# Patient Record
Sex: Female | Born: 1990 | Hispanic: No | Marital: Married | Smoking: Never smoker
Health system: Southern US, Community
[De-identification: ages and names within clinical notes are randomized; demographics above are authoritative.]

## PROBLEM LIST (undated history)

## (undated) ENCOUNTER — Inpatient Hospital Stay (HOSPITAL_COMMUNITY): Payer: Self-pay

## (undated) DIAGNOSIS — Z789 Other specified health status: Secondary | ICD-10-CM

## (undated) HISTORY — PX: NO PAST SURGERIES: SHX2092

---

## 2015-05-10 ENCOUNTER — Inpatient Hospital Stay (HOSPITAL_COMMUNITY)
Admission: AD | Admit: 2015-05-10 | Discharge: 2015-05-10 | Disposition: A | Discharge status: Home / Self Care | Payer: Medicaid Other | Source: Ambulatory Visit | Attending: Obstetrics & Gynecology | Admitting: Obstetrics & Gynecology

## 2015-05-10 ENCOUNTER — Encounter (HOSPITAL_COMMUNITY): Payer: Self-pay | Admitting: *Deleted

## 2015-05-10 DIAGNOSIS — O212 Late vomiting of pregnancy: Secondary | ICD-10-CM | POA: Diagnosis not present

## 2015-05-10 DIAGNOSIS — O219 Vomiting of pregnancy, unspecified: Secondary | ICD-10-CM | POA: Diagnosis not present

## 2015-05-10 DIAGNOSIS — Z3A22 22 weeks gestation of pregnancy: Secondary | ICD-10-CM | POA: Diagnosis not present

## 2015-05-10 HISTORY — DX: Other specified health status: Z78.9

## 2015-05-10 LAB — URINALYSIS, ROUTINE W REFLEX MICROSCOPIC
BILIRUBIN URINE: NEGATIVE
Glucose, UA: 100 mg/dL — AB
KETONES UR: NEGATIVE mg/dL
NITRITE: NEGATIVE
PH: 7.5 (ref 5.0–8.0)
PROTEIN: 100 mg/dL — AB
Specific Gravity, Urine: 1.02 (ref 1.005–1.030)
UROBILINOGEN UA: 1 mg/dL (ref 0.0–1.0)

## 2015-05-10 LAB — COMPREHENSIVE METABOLIC PANEL
ALT: 13 U/L — ABNORMAL LOW (ref 14–54)
AST: 20 U/L (ref 15–41)
Albumin: 3.2 g/dL — ABNORMAL LOW (ref 3.5–5.0)
Alkaline Phosphatase: 31 U/L — ABNORMAL LOW (ref 38–126)
Anion gap: 8 (ref 5–15)
BUN: 10 mg/dL (ref 6–20)
CO2: 26 mmol/L (ref 22–32)
Calcium: 8.9 mg/dL (ref 8.9–10.3)
Chloride: 99 mmol/L — ABNORMAL LOW (ref 101–111)
Creatinine, Ser: 0.72 mg/dL (ref 0.44–1.00)
GFR calc Af Amer: >60 mL/min (ref >60)
GFR calc non Af Amer: >60 mL/min (ref >60)
Glucose, Bld: 82 mg/dL (ref 65–99)
Potassium: 3.7 mmol/L (ref 3.5–5.1)
Sodium: 133 mmol/L — ABNORMAL LOW (ref 135–145)
Total Bilirubin: 0.3 mg/dL (ref 0.3–1.2)
Total Protein: 7.3 g/dL (ref 6.5–8.1)

## 2015-05-10 LAB — CBC
HCT: 33.4 % — ABNORMAL LOW (ref 36.0–46.0)
Hemoglobin: 11.5 g/dL — ABNORMAL LOW (ref 12.0–15.0)
MCH: 28.9 pg (ref 26.0–34.0)
MCHC: 34.4 g/dL (ref 30.0–36.0)
MCV: 83.9 fL (ref 78.0–100.0)
Platelets: 316 10*3/uL (ref 150–400)
RBC: 3.98 MIL/uL (ref 3.87–5.11)
RDW: 12.6 % (ref 11.5–15.5)
WBC: 8.5 10*3/uL (ref 4.0–10.5)

## 2015-05-10 LAB — URINE MICROSCOPIC-ADD ON

## 2015-05-10 LAB — DIFFERENTIAL
Basophils Absolute: 0 10*3/uL (ref 0.0–0.1)
Basophils Relative: 0 % (ref 0–1)
Eosinophils Absolute: 0.1 10*3/uL (ref 0.0–0.7)
Eosinophils Relative: 1 % (ref 0–5)
Lymphocytes Relative: 30 % (ref 12–46)
Lymphs Abs: 2.6 10*3/uL (ref 0.7–4.0)
Monocytes Absolute: 0.5 10*3/uL (ref 0.1–1.0)
Monocytes Relative: 6 % (ref 3–12)
Neutro Abs: 5.3 10*3/uL (ref 1.7–7.7)
Neutrophils Relative %: 63 % (ref 43–77)
Other: 0 %

## 2015-05-10 LAB — HEPATITIS B SURFACE ANTIGEN: Hepatitis B Surface Ag: NEGATIVE

## 2015-05-10 LAB — TYPE AND SCREEN
ABO/RH(D): A POS
Antibody Screen: NEGATIVE

## 2015-05-10 LAB — ABO/RH: ABO/RH(D): A POS

## 2015-05-10 MED ORDER — LACTATED RINGERS IV SOLN
INTRAVENOUS | Status: DC
Start: 2015-05-10 — End: 2015-05-11
  Administered 2015-05-10: 19:00:00 via INTRAVENOUS

## 2015-05-10 MED ORDER — SODIUM CHLORIDE 0.9 % IV SOLN
25.0000 mg | Freq: Once | INTRAVENOUS | Status: AC
  Administered 2015-05-10: 25 mg via INTRAVENOUS
  Filled 2015-05-10: qty 1

## 2015-05-10 MED ORDER — DIPHENHYDRAMINE HCL 50 MG/ML IJ SOLN
25.0000 mg | Freq: Once | INTRAMUSCULAR | Status: AC
  Administered 2015-05-10: 25 mg via INTRAVENOUS
  Filled 2015-05-10: qty 1

## 2015-05-10 MED ORDER — METOCLOPRAMIDE HCL 10 MG PO TABS: 10.0000 mg | ORAL_TABLET | Freq: Four times a day (QID) | ORAL | Status: DC

## 2015-05-10 MED ORDER — LORAZEPAM 2 MG/ML IJ SOLN
0.5000 mg | Freq: Once | INTRAMUSCULAR | Status: AC
  Administered 2015-05-10: 0.5 mg via INTRAVENOUS
  Filled 2015-05-10: qty 0.25

## 2015-05-10 NOTE — MAU Provider Note (Signed)
  History     CSN: [id number]  Arrival date and time: 05/10/15 1607   First Provider Initiated Contact with Patient 05/10/15 1726      Chief Complaint  Patient presents with  . Emesis During Pregnancy   Emesis  This is a recurrent problem. The current episode started more than 1 month ago. The problem occurs 2 to 4 times per day. The problem has been gradually worsening. The emesis has an appearance of stomach contents. There has been no fever. Pertinent negatives include no abdominal pain, chest pain, chills, diarrhea, dizziness, fever or headaches.    Alison Hooper 24 y.o. G1P0 @ [redacted]w[redacted]d presents to the MAU stating that she has had n/v of pregnancy for the past 2 months. She recently moved from the [place] and was seen for prenatal care.  Past Medical History  Diagnosis Date  . Medical history non-contributory     Past Surgical History  Procedure Laterality Date  . No past surgeries      No family history on file.  Social History  Substance Use Topics  . Smoking status: Never Smoker   . Smokeless tobacco: None  . Alcohol Use: No    Allergies: No Known Allergies  No prescriptions prior to admission    Review of Systems  Constitutional: Negative for fever and chills.  Respiratory: Negative for shortness of breath.   Cardiovascular: Negative for chest pain.  Gastrointestinal: Positive for nausea and vomiting. Negative for abdominal pain, diarrhea and constipation.  Neurological: Negative for dizziness and headaches.  All other systems reviewed and are negative.  Physical Exam   Blood pressure 109/70, pulse 80, temperature 99.2 F (37.3 C), temperature source Oral, resp. rate 16, height  (1.499 m), weight 42.366 kg (93 lb 6.4 oz), last menstrual period 02/16/2015.  Physical Exam  Nursing note and vitals reviewed. Constitutional: She is oriented to person, place, and time. She appears well-developed and well-nourished. No distress.  HENT:  Head:  Normocephalic and atraumatic.  Neck: Normal range of motion.  Cardiovascular: Normal rate.   Respiratory: Effort normal. No respiratory distress.  GI: Soft.  Musculoskeletal: Normal range of motion.  Neurological: She is alert and oriented to person, place, and time.  Skin: Skin is warm and dry.  Psychiatric: She has a normal mood and affect. Her behavior is normal. Judgment and thought content normal.    MAU Course  Procedures  MDM Iv/ Phenergan caused agitated reaction in pt; Changed POC to Lr and Benadryl . Pt is feeling better; will continue to monitor. Turn over care to L. Leftwich  Assessment and Plan  Nausea and Vomiting of pregnancy  Clemmons,Lori Grissett 05/10/2015, 5:58 PM   1. Nausea and vomiting during pregnancy prior to [redacted] weeks gestation     D/C home Reglan 10 mg PO Q 6 hours PRN F/U with early prenatal care Return to MAU as needed for emergencies  LEFTWICH-KIRBY, Nicolaus Andel, CNM 8:45 PM

## 2015-05-10 NOTE — Discharge Instructions (Signed)
Nausea medication to take during pregnancy:   Unisom (doxylamine succinate 25 mg tablets) Take one tablet daily at bedtime. If symptoms are not adequately controlled, the dose can be increased to a maximum recommended dose of two tablets daily (1/2 tablet in the morning, 1/2 tablet mid-afternoon and one at bedtime).  Vitamin B6 100mg  tablets. Take one tablet twice a day (up to 200 mg per day).  Add Reglan as prescribed to take as needed.   Morning Sickness Morning sickness is when you feel sick to your stomach (nauseous) during pregnancy. This nauseous feeling may or may not come with vomiting. It often occurs in the morning but can be a problem any time of day. Morning sickness is most common during the first trimester, but it may continue throughout pregnancy. While morning sickness is unpleasant, it is usually harmless unless you develop severe and continual vomiting (hyperemesis gravidarum). This condition requires more intense treatment.  CAUSES  The cause of morning sickness is not completely known but seems to be related to normal hormonal changes that occur in pregnancy. RISK FACTORS You are at greater risk if you:  Experienced nausea or vomiting before your pregnancy.  Had morning sickness during a previous pregnancy.  Are pregnant with more than one baby, such as twins. TREATMENT  Do not use any medicines (prescription, over-the-counter, or herbal) for morning sickness without first talking to your health care provider. Your health care provider may prescribe or recommend:  Vitamin B6 supplements.  Anti-nausea medicines.  The herbal medicine ginger. HOME CARE INSTRUCTIONS   Only take over-the-counter or prescription medicines as directed by your health care provider.  Taking multivitamins before getting pregnant can prevent or decrease the severity of morning sickness in most women.  Eat a piece of dry toast or unsalted crackers before getting out of bed in the  morning.  Eat five or six small meals a day.  Eat dry and bland foods (rice, baked potato). Foods high in carbohydrates are often helpful.  Do not drink liquids with your meals. Drink liquids between meals.  Avoid greasy, fatty, and spicy foods.  Get someone to cook for you if the smell of any food causes nausea and vomiting.  If you feel nauseous after taking prenatal vitamins, take the vitamins at night or with a snack.  Snack on protein foods (nuts, yogurt, cheese) between meals if you are hungry.  Eat unsweetened gelatins for desserts.  Wearing an acupressure wristband (worn for sea sickness) may be helpful.  Acupuncture may be helpful.  Do not smoke.  Get a humidifier to keep the air in your house free of odors.  Get plenty of fresh air. SEEK MEDICAL CARE IF:   Your home remedies are not working, and you need medicine.  You feel dizzy or lightheaded.  You are losing weight. SEEK IMMEDIATE MEDICAL CARE IF:   You have persistent and uncontrolled nausea and vomiting.  You pass out (faint). MAKE SURE YOU:  Understand these instructions.  Will watch your condition.  Will get help right away if you are not doing well or get worse. Document Released: 10/25/2006 Document Revised: 09/08/2013 Document Reviewed: 02/18/2013 [facility] Patient Information 2015 [place], [place]. This information is not intended to replace advice given to you by your health care provider. Make sure you discuss any questions you have with your health care provider. Dehydration, Adult Dehydration is when you lose more fluids from the body than you take in. Vital organs like the kidneys, brain, and heart cannot  function without a proper amount of fluids and salt. Any loss of fluids from the body can cause dehydration.  CAUSES   Vomiting.  Diarrhea.  Excessive sweating.  Excessive urine output.  Fever. SYMPTOMS  Mild dehydration  Thirst.  Dry lips.  Slightly dry mouth. Moderate  dehydration  Very dry mouth.  Sunken eyes.  Skin does not bounce back quickly when lightly pinched and released.  Dark urine and decreased urine production.  Decreased tear production.  Headache. Severe dehydration  Very dry mouth.  Extreme thirst.  Rapid, weak pulse (more than 100 beats per minute at rest).  Cold hands and feet.  Not able to sweat in spite of heat and temperature.  Rapid breathing.  Blue lips.  Confusion and lethargy.  Difficulty being awakened.  Minimal urine production.  No tears. DIAGNOSIS  Your caregiver will diagnose dehydration based on your symptoms and your exam. Blood and urine tests will help confirm the diagnosis. The diagnostic evaluation should also identify the cause of dehydration. TREATMENT  Treatment of mild or moderate dehydration can often be done at home by increasing the amount of fluids that you drink. It is best to drink small amounts of fluid more often. Drinking too much at one time can make vomiting worse. Refer to the home care instructions below. Severe dehydration needs to be treated at the hospital where you will probably be given intravenous (IV) fluids that contain water and electrolytes. HOME CARE INSTRUCTIONS   Ask your caregiver about specific rehydration instructions.  Drink enough fluids to keep your urine clear or pale yellow.  Drink small amounts frequently if you have nausea and vomiting.  Eat as you normally do.  Avoid:  Foods or drinks high in sugar.  Carbonated drinks.  Juice.  Extremely hot or cold fluids.  Drinks with caffeine.  Fatty, greasy foods.  Alcohol.  Tobacco.  Overeating.  Gelatin desserts.  Wash your hands well to avoid spreading bacteria and viruses.  Only take over-the-counter or prescription medicines for pain, discomfort, or fever as directed by your caregiver.  Ask your caregiver if you should continue all prescribed and over-the-counter medicines.  Keep all  follow-up appointments with your caregiver. SEEK MEDICAL CARE IF:  You have abdominal pain and it increases or stays in one area (localizes).  You have a rash, stiff neck, or severe headache.  You are irritable, sleepy, or difficult to awaken.  You are weak, dizzy, or extremely thirsty. SEEK IMMEDIATE MEDICAL CARE IF:   You are unable to keep fluids down or you get worse despite treatment.  You have frequent episodes of vomiting or diarrhea.  You have blood or green matter (bile) in your vomit.  You have blood in your stool or your stool looks black and tarry.  You have not urinated in 6 to 8 hours, or you have only urinated a small amount of very dark urine.  You have a fever.  You faint. MAKE SURE YOU:   Understand these instructions.  Will watch your condition.  Will get help right away if you are not doing well or get worse. Document Released: 09/03/2005 Document Revised: 11/26/2011 Document Reviewed: 04/23/2011 [facility] Patient Information 2015 [place], [place]. This information is not intended to replace advice given to you by your health care provider. Make sure you discuss any questions you have with your health care provider.

## 2015-05-10 NOTE — MAU Note (Signed)
L. Clemmons CNM notified of pt beginning to feel relief from agitation and restlessness after benedryl. Will reassess in 15 minutes

## 2015-05-10 NOTE — MAU Note (Signed)
Pt resting in bed. States she still feels restless but it is better. Requested to get up to bathroom. Offered assistance but patient denied. Ambulated to bathroom.

## 2015-05-10 NOTE — MAU Note (Signed)
Vomiting for the last month, 6-9 times yesterday, twice today.  Denies pain or bleeding.  Pos HPT 2 months ago.

## 2015-05-11 LAB — HIV ANTIBODY (ROUTINE TESTING W REFLEX): HIV Screen 4th Generation wRfx: NONREACTIVE

## 2015-05-11 LAB — CULTURE, OB URINE: Culture: NO GROWTH

## 2015-05-11 LAB — RPR: RPR Ser Ql: NONREACTIVE

## 2015-05-11 LAB — RUBELLA SCREEN: Rubella: 7.4 index (ref >0.99)

## 2015-06-20 ENCOUNTER — Ambulatory Visit (INDEPENDENT_AMBULATORY_CARE_PROVIDER_SITE_OTHER): Payer: Medicaid Other | Admitting: Obstetrics and Gynecology

## 2015-06-20 ENCOUNTER — Other Ambulatory Visit (HOSPITAL_COMMUNITY)
Admission: RE | Admit: 2015-06-20 | Discharge: 2015-06-20 | Disposition: A | Discharge status: Home / Self Care | Payer: Medicaid Other | Source: Ambulatory Visit | Attending: Obstetrics and Gynecology | Admitting: Obstetrics and Gynecology

## 2015-06-20 ENCOUNTER — Encounter: Payer: Self-pay | Admitting: Obstetrics and Gynecology

## 2015-06-20 VITALS — BP 111/66 | HR 90 | Wt 98.0 lb

## 2015-06-20 DIAGNOSIS — Z124 Encounter for screening for malignant neoplasm of cervix: Secondary | ICD-10-CM | POA: Diagnosis not present

## 2015-06-20 DIAGNOSIS — Z113 Encounter for screening for infections with a predominantly sexual mode of transmission: Secondary | ICD-10-CM | POA: Insufficient documentation

## 2015-06-20 DIAGNOSIS — Z118 Encounter for screening for other infectious and parasitic diseases: Secondary | ICD-10-CM | POA: Diagnosis not present

## 2015-06-20 DIAGNOSIS — Z01419 Encounter for gynecological examination (general) (routine) without abnormal findings: Secondary | ICD-10-CM | POA: Insufficient documentation

## 2015-06-20 DIAGNOSIS — Z1151 Encounter for screening for human papillomavirus (HPV): Secondary | ICD-10-CM

## 2015-06-20 DIAGNOSIS — Z3402 Encounter for supervision of normal first pregnancy, second trimester: Secondary | ICD-10-CM

## 2015-06-20 DIAGNOSIS — Z3400 Encounter for supervision of normal first pregnancy, unspecified trimester: Secondary | ICD-10-CM | POA: Insufficient documentation

## 2015-06-20 LAB — POCT URINALYSIS DIP (DEVICE)
Bilirubin Urine: NEGATIVE
GLUCOSE, UA: NEGATIVE mg/dL
KETONES UR: NEGATIVE mg/dL
Nitrite: NEGATIVE
Specific Gravity, Urine: 1.025 (ref 1.005–1.030)
UROBILINOGEN UA: 0.2 mg/dL (ref 0.0–1.0)
pH: 7 (ref 5.0–8.0)

## 2015-06-20 NOTE — Progress Notes (Signed)
   Subjective:    Alison Hooper is a G1P0 [redacted]w[redacted]d being seen today for her first obstetrical visit.  Her obstetrical history is significant for first preganncy. Patient does intend to breast feed. Pregnancy history fully reviewed.  Patient reports no complaints.  Filed Vitals:   06/20/15 0900  BP: 111/66  Pulse: 90  Weight: 98 lb (44.453 kg)    HISTORY: OB History  Gravida Para Term Preterm AB SAB TAB Ectopic Multiple Living  1             # Outcome Date GA Lbr Len/2nd Weight Sex Delivery Anes PTL Lv  1 Current              Past Medical History  Diagnosis Date  . Medical history non-contributory    Past Surgical History  Procedure Laterality Date  . No past surgeries     Family History  Problem Relation Age of Onset  . Hypertension Mother   . Hypertension Father      Exam    Uterus:     Pelvic Exam:    Perineum: No Hemorrhoids, Normal Perineum   Vulva: normal   Vagina:  normal mucosa, normal discharge   pH:    Cervix: closed, thick, long   Adnexa: not evaluated   Bony Pelvis: gynecoid  System: Breast:  normal appearance, no masses or tenderness   Skin: normal coloration and turgor, no rashes    Neurologic: oriented, no focal deficits   Extremities: normal strength, tone, and muscle mass   HEENT extra ocular movement intact   Mouth/Teeth mucous membranes moist, pharynx normal without lesions and dental hygiene good   Neck supple and no masses   Cardiovascular: regular rate and rhythm   Respiratory:  chest clear, no wheezing, crepitations, rhonchi, normal symmetric air entry   Abdomen: soft, non-tender; bowel sounds normal; no masses,  no organomegaly   Urinary:       Assessment:    Pregnancy: G1P0 Patient Active Problem List   Diagnosis Date Noted  . Encounter for supervision of normal first pregnancy 06/20/2015        Plan:     Initial labs drawn. Prenatal vitamins. Problem list reviewed and updated. Genetic Screening discussed Quad  Screen: ordered.  Ultrasound discussed; fetal survey: ordered.  Follow up in 4 weeks. 50% of 30 min visit spent on counseling and coordination of care.     Daizy Outen 06/20/2015

## 2015-06-20 NOTE — Progress Notes (Signed)
Pt requesting anatomy scan. She refuses flu shot. Had some [place] in [place]

## 2015-06-20 NOTE — Progress Notes (Signed)
Ultrasound scheduled for 06/24/2015 @

## 2015-06-20 NOTE — Addendum Note (Signed)
Addended by: Sherre Lain A on: 06/20/2015 10:27 AM   Modules accepted: Orders

## 2015-06-21 LAB — PRESCRIPTION MONITORING PROFILE (19 PANEL)
Amphetamine/Meth: NEGATIVE ng/mL
Barbiturate Screen, Urine: NEGATIVE ng/mL
Benzodiazepine Screen, Urine: NEGATIVE ng/mL
Buprenorphine, Urine: NEGATIVE ng/mL
CARISOPRODOL, URINE: NEGATIVE ng/mL
Cannabinoid Scrn, Ur: NEGATIVE ng/mL
Cocaine Metabolites: NEGATIVE ng/mL
Creatinine, Urine: 188.96 mg/dL (ref >20.0)
ECSTASY: NEGATIVE ng/mL
FENTANYL URINE: NEGATIVE ng/mL
MEPERIDINE UR: NEGATIVE ng/mL
METHADONE SCREEN, URINE: NEGATIVE ng/mL
Methaqualone: NEGATIVE ng/mL
NITRITES URINE, INITIAL: NEGATIVE ug/mL
Opiate Screen, Urine: NEGATIVE ng/mL
Oxycodone Screen, Ur: NEGATIVE ng/mL
PROPOXYPHENE: NEGATIVE ng/mL
Phencyclidine, Ur: NEGATIVE ng/mL
TRAMADOL UR: NEGATIVE ng/mL
Tapentadol, urine: NEGATIVE ng/mL
ZOLPIDEM, URINE: NEGATIVE ng/mL
pH, Initial: 8.4 pH (ref 4.5–8.9)

## 2015-06-21 LAB — CYTOLOGY - PAP

## 2015-06-21 LAB — GC/CHLAMYDIA PROBE AMP (~~LOC~~) NOT AT ARMC
CHLAMYDIA, DNA PROBE: NEGATIVE
Neisseria Gonorrhea: NEGATIVE

## 2015-06-22 LAB — AFP, QUAD SCREEN
AFP: 67.8 ng/mL
Curr Gest Age: 19.3 wks.days
HCG TOTAL: 47.69 [IU]/mL
INH: 304.9 pg/mL
INTERPRETATION-AFP: NEGATIVE
MOM FOR AFP: 1.06
MoM for INH: 1.44
MoM for hCG: 1.72
OPEN SPINA BIFIDA: NEGATIVE
Osb Risk: 1:10900 {titer}
TRI 18 SCR RISK EST: NEGATIVE
Trisomy 18 (Edward) Syndrome Interp.: <1:89300 {titer}
uE3 Mom: 1.74
uE3 Value: 3.11 ng/mL

## 2015-06-24 ENCOUNTER — Other Ambulatory Visit: Payer: Self-pay | Admitting: Obstetrics and Gynecology

## 2015-06-24 ENCOUNTER — Ambulatory Visit (HOSPITAL_COMMUNITY)
Admission: RE | Admit: 2015-06-24 | Discharge: 2015-06-24 | Disposition: A | Discharge status: Home / Self Care | Payer: Medicaid Other | Source: Ambulatory Visit | Attending: Obstetrics and Gynecology | Admitting: Obstetrics and Gynecology

## 2015-06-24 DIAGNOSIS — Z36 Encounter for antenatal screening of mother: Secondary | ICD-10-CM | POA: Diagnosis present

## 2015-06-24 DIAGNOSIS — Z3402 Encounter for supervision of normal first pregnancy, second trimester: Secondary | ICD-10-CM

## 2015-06-24 DIAGNOSIS — Z3A20 20 weeks gestation of pregnancy: Secondary | ICD-10-CM

## 2015-06-24 DIAGNOSIS — Z1389 Encounter for screening for other disorder: Secondary | ICD-10-CM

## 2015-07-19 ENCOUNTER — Ambulatory Visit (INDEPENDENT_AMBULATORY_CARE_PROVIDER_SITE_OTHER): Payer: Medicaid Other | Admitting: Obstetrics and Gynecology

## 2015-07-19 VITALS — BP 118/66 | HR 90 | Wt 105.8 lb

## 2015-07-19 DIAGNOSIS — L209 Atopic dermatitis, unspecified: Secondary | ICD-10-CM | POA: Diagnosis not present

## 2015-07-19 DIAGNOSIS — Z23 Encounter for immunization: Secondary | ICD-10-CM | POA: Diagnosis not present

## 2015-07-19 DIAGNOSIS — Z3402 Encounter for supervision of normal first pregnancy, second trimester: Secondary | ICD-10-CM

## 2015-07-19 LAB — POCT URINALYSIS DIP (DEVICE)
BILIRUBIN URINE: NEGATIVE
Glucose, UA: NEGATIVE mg/dL
Ketones, ur: NEGATIVE mg/dL
NITRITE: NEGATIVE
Protein, ur: 100 mg/dL — AB
Specific Gravity, Urine: >=1.03 (ref 1.005–1.030)
Urobilinogen, UA: 0.2 mg/dL (ref 0.0–1.0)
pH: 6.5 (ref 5.0–8.0)

## 2015-07-19 MED ORDER — TRIAMCINOLONE ACETONIDE 0.025 % EX OINT: 1.0000 "application " | TOPICAL_OINTMENT | Freq: Two times a day (BID) | CUTANEOUS | Status: DC

## 2015-07-19 NOTE — Progress Notes (Signed)
Patient reports new rash for past 3 weeks on arms, chest, and under breast

## 2015-07-19 NOTE — Addendum Note (Signed)
Addended by: Shonna ChockWOUK, Shira Bobst B on: 07/19/2015 10:04 AM   Modules accepted: Orders

## 2015-07-19 NOTE — Progress Notes (Signed)
Subjective:  Alison Hooper is a [age] y.o. G1P0 at 6135w4d being seen today for ongoing prenatal care.  Patient reports no complaints.  Contractions: Not present.  Vag. Bleeding: None. Movement: Present. Denies leaking of fluid.   The following portions of the patient's history were reviewed and updated as appropriate: allergies, current medications, past family history, past medical history, past social history, past surgical history and problem list. Problem list updated.  Objective:   Filed Vitals:   07/19/15 0945  BP: 118/66  Pulse: 90  Weight: 105 lb 12.8 oz (47.991 kg)    Fetal Status: Fetal Heart Rate (bpm): 154   Movement: Present     General:  Alert, oriented and cooperative. Patient is in no acute distress.  Skin: Skin is warm and dry. No rash noted.   Cardiovascular: Normal heart rate noted  Respiratory: Normal respiratory effort, no problems with respiration noted  Abdomen: Soft, gravid, appropriate for gestational age. Pain/Pressure: Absent     Pelvic: Vag. Bleeding: None     Cervical exam deferred        Extremities: Normal range of motion.  Edema: None  Mental Status: Normal mood and affect. Normal behavior. Normal judgment and thought content.   Urinalysis: Urine Protein: 2+ Urine Glucose: Negative  Assessment and Plan:  Pregnancy: G1P0 at 7335w4d  1. Encounter for supervision of normal first pregnancy in second trimester - flu vaccine today  Preterm labor symptoms and general obstetric precautions including but not limited to vaginal bleeding, contractions, leaking of fluid and fetal movement were reviewed in detail with the patient. Please refer to After Visit Summary for other counseling recommendations.  Return in about 4 weeks (around 08/16/2015).   Kathrynn RunningNoah Bedford [place], MD

## 2015-07-19 NOTE — Progress Notes (Signed)
Patient also complains of rash. Itchy rash on arms and on back. Happened this time last year. No feet or hands affected. On exam, excoriated rerythematous papules and plaques extensor arms and middle of back. Abdomen not affected. Think this eczema, will prescribe corticosteroid cream.

## 2015-07-19 NOTE — Addendum Note (Signed)
Addended by: Kathee DeltonHILLMAN, Arun Herrod L on: 07/19/2015 09:49 AM   Modules accepted: Orders, Medications

## 2015-07-19 NOTE — Progress Notes (Signed)
Mod blood & small leuks on UA

## 2015-08-18 ENCOUNTER — Ambulatory Visit (INDEPENDENT_AMBULATORY_CARE_PROVIDER_SITE_OTHER): Payer: Medicaid Other | Admitting: Student

## 2015-08-18 VITALS — BP 122/67 | HR 90 | Temp 98.1°F | Wt 107.5 lb

## 2015-08-18 DIAGNOSIS — Z3402 Encounter for supervision of normal first pregnancy, second trimester: Secondary | ICD-10-CM

## 2015-08-18 DIAGNOSIS — R319 Hematuria, unspecified: Secondary | ICD-10-CM

## 2015-08-18 DIAGNOSIS — Z23 Encounter for immunization: Secondary | ICD-10-CM | POA: Diagnosis not present

## 2015-08-18 LAB — CBC
HCT: 32.4 % — ABNORMAL LOW (ref 36.0–46.0)
HEMOGLOBIN: 10.7 g/dL — AB (ref 12.0–15.0)
MCH: 29.2 pg (ref 26.0–34.0)
MCHC: 33 g/dL (ref 30.0–36.0)
MCV: 88.3 fL (ref 78.0–100.0)
MPV: 10 fL (ref 8.6–12.4)
Platelets: 336 10*3/uL (ref 150–400)
RBC: 3.67 MIL/uL — AB (ref 3.87–5.11)
RDW: 13 % (ref 11.5–15.5)
WBC: 8.8 10*3/uL (ref 4.0–10.5)

## 2015-08-18 LAB — POCT URINALYSIS DIP (DEVICE)
Glucose, UA: NEGATIVE mg/dL
KETONES UR: NEGATIVE mg/dL
Nitrite: NEGATIVE
PH: 6.5 (ref 5.0–8.0)
Protein, ur: >=300 mg/dL — AB
Urobilinogen, UA: 0.2 mg/dL (ref 0.0–1.0)

## 2015-08-18 MED ORDER — TETANUS-DIPHTH-ACELL PERTUSSIS 5-2.5-18.5 LF-MCG/0.5 IM SUSP
0.5000 mL | Freq: Once | INTRAMUSCULAR | Status: AC
  Administered 2015-08-18: 0.5 mL via INTRAMUSCULAR

## 2015-08-18 NOTE — Progress Notes (Signed)
Increased urinary frequency Denies abdominal pain/ctx/vb/flank pain/fever/dysuria/n/v   Subjective:  Alison Hooper is a 24 y.o. G1P0 at 5421w6d being seen today for ongoing prenatal care.  She is currently monitored for the following issues for this low-risk pregnancy and has Encounter for supervision of normal first pregnancy and Atopic dermatitis on her problem list.  Patient reports no complaints.  Contractions: Irritability. Vag. Bleeding: None.  Movement: Present. Denies leaking of fluid. Patient reports some vaginal discharge with irritation 2 weeks ago; none since. Patient denies any urinary complaints, no flank pain, fever, dysuria, hematuria. Denies abdominal pain, LOF, or vaginal bleeding.    The following portions of the patient's history were reviewed and updated as appropriate: allergies, current medications, past family history, past medical history, past social history, past surgical history and problem list. Problem list updated.  Objective:   Filed Vitals:   08/18/15 1322  BP: 122/67  Pulse: 90  Temp: 98.1 F (36.7 C)  Weight: 107 lb 8 oz (48.762 kg)    Fetal Status: Fetal Heart Rate (bpm): 150 Fundal Height: 27 cm Movement: Present     General:  Alert, oriented and cooperative. Patient is in no acute distress.  Skin: Skin is warm and dry. No rash noted.   Cardiovascular: Normal heart rate noted  Respiratory: Normal respiratory effort, no problems with respiration noted  Abdomen: Soft, gravid, appropriate for gestational age. Pain/Pressure: Present     Pelvic: Vag. Bleeding: None Vag D/C Character: White   Cervical exam deferred        Extremities: Normal range of motion.  Edema: None  Mental Status: Normal mood and affect. Normal behavior. Normal judgment and thought content.   Urinalysis: Urine Protein: 3+ Urine Glucose: Negative  Assessment and Plan:  Pregnancy: G1P0 at 2321w6d  1. Encounter for supervision of normal first pregnancy in second trimester  -  HIV antibody (with reflex) - RPR - CBC  2. Hematuria  -urine dip show hemoglobin on last several samples - Culture, OB Urine  Preterm labor symptoms and general obstetric precautions including but not limited to vaginal bleeding, contractions, leaking of fluid and fetal movement were reviewed in detail with the patient. Please refer to After Visit Summary for other counseling recommendations.  Return in about 2 weeks (around 09/01/2015).   Judeth HornErin Tierany Appleby, NP

## 2015-08-18 NOTE — Progress Notes (Signed)
Urine: large amount of blood 1 hr gtt, 28 week labs today Patient c/o white itchy discharge x2 weeks Breastfeeding tip of the week reviewed Tdap today

## 2015-08-18 NOTE — Patient Instructions (Signed)
Fetal Movement Counts Patient Name: __________________________________________________ Patient Due Date: ____________________ Performing a fetal movement count is highly recommended in high-risk pregnancies, but it is good for every pregnant woman to do. Your health care provider may ask you to start counting fetal movements at 28 weeks of the pregnancy. Fetal movements often increase:  After eating a full meal.  After physical activity.  After eating or drinking something sweet or cold.  At rest. Pay attention to when you feel the baby is most active. This will help you notice a pattern of your baby's sleep and wake cycles and what factors contribute to an increase in fetal movement. It is important to perform a fetal movement count at the same time each day when your baby is normally most active.  HOW TO COUNT FETAL MOVEMENTS 1. Find a quiet and comfortable area to sit or lie down on your left side. Lying on your left side provides the best blood and oxygen circulation to your baby. 2. Write down the day and time on a sheet of paper or in a journal. 3. Start counting kicks, flutters, swishes, rolls, or jabs in a 2-hour period. You should feel at least 10 movements within 2 hours. 4. If you do not feel 10 movements in 2 hours, wait 2-3 hours and count again. Look for a change in the pattern or not enough counts in 2 hours. SEEK MEDICAL CARE IF:  You feel less than 10 counts in 2 hours, tried twice.  There is no movement in over an hour.  The pattern is changing or taking longer each day to reach 10 counts in 2 hours.  You feel the baby is not moving as he or she usually does. Date: ____________ Movements: ____________ Start time: ____________ Finish time: ____________  Date: ____________ Movements: ____________ Start time: ____________ Finish time: ____________ Date: ____________ Movements: ____________ Start time: ____________ Finish time: ____________ Date: ____________ Movements:  ____________ Start time: ____________ Finish time: ____________ Date: ____________ Movements: ____________ Start time: ____________ Finish time: ____________ Date: ____________ Movements: ____________ Start time: ____________ Finish time: ____________ Date: ____________ Movements: ____________ Start time: ____________ Finish time: ____________ Date: ____________ Movements: ____________ Start time: ____________ Finish time: ____________  Date: ____________ Movements: ____________ Start time: ____________ Finish time: ____________ Date: ____________ Movements: ____________ Start time: ____________ Finish time: ____________ Date: ____________ Movements: ____________ Start time: ____________ Finish time: ____________ Date: ____________ Movements: ____________ Start time: ____________ Finish time: ____________ Date: ____________ Movements: ____________ Start time: ____________ Finish time: ____________ Date: ____________ Movements: ____________ Start time: ____________ Finish time: ____________ Date: ____________ Movements: ____________ Start time: ____________ Finish time: ____________  Date: ____________ Movements: ____________ Start time: ____________ Finish time: ____________ Date: ____________ Movements: ____________ Start time: ____________ Finish time: ____________ Date: ____________ Movements: ____________ Start time: ____________ Finish time: ____________ Date: ____________ Movements: ____________ Start time: ____________ Finish time: ____________ Date: ____________ Movements: ____________ Start time: ____________ Finish time: ____________ Date: ____________ Movements: ____________ Start time: ____________ Finish time: ____________ Date: ____________ Movements: ____________ Start time: ____________ Finish time: ____________  Date: ____________ Movements: ____________ Start time: ____________ Finish time: ____________ Date: ____________ Movements: ____________ Start time: ____________ Finish  time: ____________ Date: ____________ Movements: ____________ Start time: ____________ Finish time: ____________ Date: ____________ Movements: ____________ Start time: ____________ Finish time: ____________ Date: ____________ Movements: ____________ Start time: ____________ Finish time: ____________ Date: ____________ Movements: ____________ Start time: ____________ Finish time: ____________ Date: ____________ Movements: ____________ Start time: ____________ Finish time: ____________  Date: ____________ Movements: ____________ Start time: ____________ Finish   time: ____________ Date: ____________ Movements: ____________ Start time: ____________ Finish time: ____________ Date: ____________ Movements: ____________ Start time: ____________ Finish time: ____________ Date: ____________ Movements: ____________ Start time: ____________ Finish time: ____________ Date: ____________ Movements: ____________ Start time: ____________ Finish time: ____________ Date: ____________ Movements: ____________ Start time: ____________ Finish time: ____________ Date: ____________ Movements: ____________ Start time: ____________ Finish time: ____________  Date: ____________ Movements: ____________ Start time: ____________ Finish time: ____________ Date: ____________ Movements: ____________ Start time: ____________ Finish time: ____________ Date: ____________ Movements: ____________ Start time: ____________ Finish time: ____________ Date: ____________ Movements: ____________ Start time: ____________ Finish time: ____________ Date: ____________ Movements: ____________ Start time: ____________ Finish time: ____________ Date: ____________ Movements: ____________ Start time: ____________ Finish time: ____________ Date: ____________ Movements: ____________ Start time: ____________ Finish time: ____________  Date: ____________ Movements: ____________ Start time: ____________ Finish time: ____________ Date: ____________  Movements: ____________ Start time: ____________ Finish time: ____________ Date: ____________ Movements: ____________ Start time: ____________ Finish time: ____________ Date: ____________ Movements: ____________ Start time: ____________ Finish time: ____________ Date: ____________ Movements: ____________ Start time: ____________ Finish time: ____________ Date: ____________ Movements: ____________ Start time: ____________ Finish time: ____________ Date: ____________ Movements: ____________ Start time: ____________ Finish time: ____________  Date: ____________ Movements: ____________ Start time: ____________ Finish time: ____________ Date: ____________ Movements: ____________ Start time: ____________ Finish time: ____________ Date: ____________ Movements: ____________ Start time: ____________ Finish time: ____________ Date: ____________ Movements: ____________ Start time: ____________ Finish time: ____________ Date: ____________ Movements: ____________ Start time: ____________ Finish time: ____________ Date: ____________ Movements: ____________ Start time: ____________ Finish time: ____________   This information is not intended to replace advice given to you by your health care provider. Make sure you discuss any questions you have with your health care provider.   Document Released: 10/03/2006 Document Revised: 09/24/2014 Document Reviewed: 06/30/2012 Elsevier Interactive Patient Education 2016 Elsevier Inc[employer] Preterm Labor Information Preterm labor is when labor starts at less than 37 weeks of pregnancy. The normal length of a pregnancy is 39 to 41 weeks. CAUSES Often, there is no identifiable underlying cause as to why a woman goes into preterm labor. One of the most common known causes of preterm labor is infection. Infections of the uterus, cervix, vagina, amniotic sac, bladder, kidney, or even the lungs (pneumonia) can cause labor to start. Other suspected causes of preterm labor include:    Urogenital infections, such as yeast infections and bacterial vaginosis.   Uterine abnormalities (uterine shape, uterine septum, fibroids, or bleeding from the placenta).   A cervix that has been operated on (it may fail to stay closed).   Malformations in the fetus.   Multiple gestations (twins, triplets, and so on).   Breakage of the amniotic sac.  RISK FACTORS 5. Having a previous history of preterm labor.  6. Having premature rupture of membranes (PROM).  7. Having a placenta that covers the opening of the cervix (placenta previa).  8. Having a placenta that separates from the uterus (placental abruption).  9. Having a cervix that is too weak to hold the fetus in the uterus (incompetent cervix).  10. Having too much fluid in the amniotic sac (polyhydramnios).  11. Taking illegal drugs or smoking while pregnant.  12. Not gaining enough weight while pregnant.  13. Being younger than 18 and older than 24 years old.  14. Having a low socioeconomic status.  15. Being African American. SYMPTOMS Signs and symptoms of preterm labor include:   Menstrual-like cramps, abdominal pain, or back pain.  Uterine contractions that are regular, as   frequent as six in an hour, regardless of their intensity (may be mild or painful).  Contractions that start on the top of the uterus and spread down to the lower abdomen and back.   A sense of increased pelvic pressure.   A watery or bloody mucus discharge that comes from the vagina.  TREATMENT Depending on the length of the pregnancy and other circumstances, your health care provider may suggest bed rest. If necessary, there are medicines that can be given to stop contractions and to mature the fetal lungs. If labor happens before 34 weeks of pregnancy, a prolonged hospital stay may be recommended. Treatment depends on the condition of both you and the fetus.  WHAT SHOULD YOU DO IF YOU THINK YOU ARE IN PRETERM LABOR? Call  your health care provider right away. You will need to go to the hospital to get checked immediately. HOW CAN YOU PREVENT PRETERM LABOR IN FUTURE PREGNANCIES? You should:   Stop smoking if you smoke.  Maintain healthy weight gain and avoid chemicals and drugs that are not necessary.  Be watchful for any type of infection.  Inform your health care provider if you have a known history of preterm labor.   This information is not intended to replace advice given to you by your health care provider. Make sure you discuss any questions you have with your health care provider.   Document Released: 11/24/2003 Document Revised: 05/06/2013 Document Reviewed: 10/06/2012 Elsevier Interactive Patient Education 2016 Elsevier Inc.  

## 2015-08-19 LAB — RPR

## 2015-08-19 LAB — CULTURE, OB URINE
Colony Count: NO GROWTH
Organism ID, Bacteria: NO GROWTH

## 2015-08-19 LAB — GLUCOSE TOLERANCE, 1 HOUR (50G) W/O FASTING: Glucose, 1 Hour GTT: 169 mg/dL — ABNORMAL HIGH (ref 70–140)

## 2015-08-22 LAB — HIV ANTIBODY (ROUTINE TESTING W REFLEX): HIV 1&2 Ab, 4th Generation: NONREACTIVE

## 2015-08-23 ENCOUNTER — Other Ambulatory Visit: Payer: Self-pay | Admitting: Student

## 2015-08-23 DIAGNOSIS — O99012 Anemia complicating pregnancy, second trimester: Secondary | ICD-10-CM

## 2015-08-23 MED ORDER — FERROUS SULFATE 325 (65 FE) MG PO TABS: 325.0000 mg | ORAL_TABLET | Freq: Every day | ORAL | Status: DC

## 2015-08-24 ENCOUNTER — Telehealth: Payer: Self-pay

## 2015-08-24 NOTE — Telephone Encounter (Signed)
Per Judeth HornErin Lawrence, pt has abnormal 1 hr and needs 3 hr gtt.  Called pt and informed pt of results.  Explained the 3hr gtt testing, pt stated that she will be able to come in tomorrow, 08/24/15 @ 0800 for her lab.   Message sent to front office to schedule pt's lab.

## 2015-08-25 ENCOUNTER — Other Ambulatory Visit: Payer: Medicaid Other

## 2015-09-01 ENCOUNTER — Ambulatory Visit (INDEPENDENT_AMBULATORY_CARE_PROVIDER_SITE_OTHER): Payer: Medicaid Other | Admitting: Family

## 2015-09-01 VITALS — BP 124/74 | HR 85 | Temp 98.4°F | Wt 109.9 lb

## 2015-09-01 DIAGNOSIS — Z3402 Encounter for supervision of normal first pregnancy, second trimester: Secondary | ICD-10-CM

## 2015-09-01 LAB — POCT URINALYSIS DIP (DEVICE)
Glucose, UA: NEGATIVE mg/dL
Ketones, ur: 15 mg/dL — AB
Nitrite: NEGATIVE
Protein, ur: >=300 mg/dL — AB
Specific Gravity, Urine: >=1.03 (ref 1.005–1.030)
Urobilinogen, UA: 0.2 mg/dL (ref 0.0–1.0)
pH: 7 (ref 5.0–8.0)

## 2015-09-01 NOTE — Progress Notes (Signed)
Subjective:  Alison Hooper is a 24 y.o. G1P0 at 5920w6d being seen today for ongoing prenatal care.  She is currently monitored for the following issues for this low-risk pregnancy and has supervision of normal first pregnancy and Atopic dermatitis on her problem list.  Pt reports going to [place] on Monday until January 5th.  Unable to come tomorrow for 3 hr gtt.  Patient reports no complaints.  Contractions: Not present. Vag. Bleeding: None.  Movement: Present. Denies leaking of fluid.   The following portions of the patient's history were reviewed and updated as appropriate: allergies, current medications, past family history, past medical history, past social history, past surgical history and problem list. Problem list updated.  Objective:   Filed Vitals:   09/01/15 1514  BP: 124/74  Pulse: 85  Temp: 98.4 F (36.9 C)  Weight: 109 lb 14.4 oz (49.85 kg)    Fetal Status: Fetal Heart Rate (bpm): 140 Fundal Height: 28 cm Movement: Present     General:  Alert, oriented and cooperative. Patient is in no acute distress.  Skin: Skin is warm and dry. No rash noted.   Cardiovascular: Normal heart rate noted  Respiratory: Normal respiratory effort, no problems with respiration noted  Abdomen: Soft, gravid, appropriate for gestational age. Pain/Pressure: Present     Pelvic: Vag. Bleeding: None     Cervical exam deferred        Extremities: Normal range of motion.  Edema: None  Mental Status: Normal mood and affect. Normal behavior. Normal judgment and thought content.   Urinalysis: Urine Protein: 3+ Urine Glucose: Negative  Assessment and Plan:  Pregnancy: G1P0 at 2320w6d  1. Encounter for supervision of normal first pregnancy in second trimester  2. Elevated 1 hr - 167 - Pt unable to get 3 hr GTT before going to [place] on Monday.  Explained the importance of early diagnosis and management of GDM.    Preterm labor symptoms and general obstetric precautions including but not  limited to vaginal bleeding, contractions, leaking of fluid and fetal movement were reviewed in detail with the patient. Please refer to After Visit Summary for other counseling recommendations.  Return in about 3 weeks (around 09/22/2015). when return from [place].   Eino FarberWalidah Kennith GainN Karim, CNM

## 2015-09-18 NOTE — L&D Delivery Note (Signed)
Delivery Note Pt progressed nicely through labor.  She has SROM around 0400 with clear/light mec fluid. After about an hour of pushing, at 5:58 AM a viable female was delivered via Vaginal, Spontaneous Delivery (Presentation: ; Occiput Anterior).  APGAR: 8, 9; weight  Pending. After 3 minutes, the cord was clamped and cut. 40 units of pitocin diluted in 1000cc LR was infused rapidly IV.  The placenta separated spontaneously and delivered via CCT and maternal pushing effort.  It was inspected and appears to be intact with a 3 VC.  She had a boggy uterus and some extra bleeding, so cytotec was given PR.   Anesthesia: Epidural  Episiotomy: None Lacerations: 1st degree;Labial Suture Repair: 3.0 vicryl Est. Blood Loss (mL):  350  Mom to postpartum.  Baby to Couplet care / Skin to Skin.  CRESENZO-DISHMAN,[name] 11/11/2015, 6:27 AM

## 2015-10-05 ENCOUNTER — Ambulatory Visit (INDEPENDENT_AMBULATORY_CARE_PROVIDER_SITE_OTHER): Payer: Medicaid Other | Admitting: Certified Nurse Midwife

## 2015-10-05 VITALS — BP 122/89 | HR 88 | Temp 98.2°F | Wt 115.9 lb

## 2015-10-05 DIAGNOSIS — Z3403 Encounter for supervision of normal first pregnancy, third trimester: Secondary | ICD-10-CM | POA: Diagnosis not present

## 2015-10-05 LAB — POCT URINALYSIS DIP (DEVICE)
Bilirubin Urine: NEGATIVE
Glucose, UA: NEGATIVE mg/dL
Ketones, ur: NEGATIVE mg/dL
Nitrite: NEGATIVE
Protein, ur: >=300 mg/dL — AB
Specific Gravity, Urine: 1.025 (ref 1.005–1.030)
Urobilinogen, UA: 0.2 mg/dL (ref 0.0–1.0)
pH: 7 (ref 5.0–8.0)

## 2015-10-05 NOTE — Progress Notes (Signed)
Subjective:  Alison Hooper is a 25 y.o. G1P0 at [redacted]w[redacted]d being seen today for ongoing prenatal care.  She is currently monitored for the following issues for this low-risk pregnancy and has Encounter for supervision of normal first pregnancy and Atopic dermatitis on her problem list.  Patient reports no complaints.  Contractions: Irritability. Vag. Bleeding: None.  Movement: Present. Denies leaking of fluid.   The following portions of the patient's history were reviewed and updated as appropriate: allergies, current medications, past family history, past medical history, past social history, past surgical history and problem list. Problem list updated.  Objective:   Filed Vitals:   10/05/15 1120  BP: 122/89  Pulse: 88  Temp: 98.2 F (36.8 C)  Weight: 115 lb 14.4 oz (52.572 kg)    Fetal Status: Fetal Heart Rate (bpm): 175 Fundal Height: 33 cm Movement: Present     General:  Alert, oriented and cooperative. Patient is in no acute distress.  Skin: Skin is warm and dry. No rash noted.   Cardiovascular: Normal heart rate noted  Respiratory: Normal respiratory effort, no problems with respiration noted  Abdomen: Soft, gravid, appropriate for gestational age. Pain/Pressure: Absent     Pelvic: Vag. Bleeding: None     Cervical exam deferred        Extremities: Normal range of motion.  Edema: None  Mental Status: Normal mood and affect. Normal behavior. Normal judgment and thought content.   Urinalysis: Urine Protein: 3+ Urine Glucose: Negative  Assessment and Plan:  Pregnancy: G1P0 at [redacted]w[redacted]d  There are no diagnoses linked to this encounter. Preterm labor symptoms and general obstetric precautions including but not limited to vaginal bleeding, contractions, leaking of fluid and fetal movement were reviewed in detail with the patient. Please refer to After Visit Summary for other counseling recommendations.  Return in about 1 week (around 10/12/2015) for schedule 3 hour GTT.   Rhea Pink, CNM

## 2015-10-05 NOTE — Patient Instructions (Signed)
Prueba de tolerancia a la glucosa durante el embarazo (Glucose Tolerance Test During Pregnancy) La prueba de tolerancia a la glucosa es un anlisis de sangre que se usa para determinar si ha contrado un tipo de diabetes durante el embarazo (diabetes gestacional). Esto es cuando el cuerpo no procesa de forma correcta el azcar (glucosa) en los alimentos que come, lo cual provoca niveles sanguneos altos de glucosa. Por lo general, la PTG se realiza despus de haberse hecho una prueba de glucosa de 1 hora, cuyos resultados indican que posiblemente tiene diabetes gestacional. Tambin se puede hacer si:  Tiene antecedentes de haber parido bebs muy grandes o antecedentes de muerte fetal repetida (beb nacido muerto).  Tiene signos o sntomas de diabetes, tales como:  Cambios en la visin.  Hormigueo o adormecimiento en las manos o los pies.  Cambios en el hambre, la sed y la miccin que no se explican por el embarazo. La PTG dura unas 3 horas. Le darn para beber una solucin de agua y azcar al principio de la prueba. Le extraern sangre antes de que beba la solucin, y 1, 2 y 3 horas despus de beberla. No se le permitir comer ni beber nada ms durante la prueba. Debe permanecer en el lugar en que se realiza la prueba para asegurarse de que la sangre se extraiga puntualmente. Tambin debe evitar realizar ejercicios durante la prueba porque esto puede alterar los resultados. PREPARACIN PARA EL ESTUDIO  Coma normalmente durante 3 das antes de la PTG e incluya muchos alimentos ricos en carbohidratos. No coma ni beba nada, excepto agua, durante las ltimas 12 horas antes de la prueba. Adems, el mdico puede pedirle que deje de tomar ciertos medicamentos antes de la prueba. RESULTADOS  Es su responsabilidad retirar el resultado del estudio. Consulte en el laboratorio o en el departamento en el que fue realizado el estudio cundo y cmo podr obtener los resultados. Comunquese con el mdico si tiene  preguntas sobre los resultados.  Rango de valores normales Los rangos para los valores normales pueden variar entre diferentes laboratorios y hospitales. Siempre debe consultar a su mdico despus de realizarse un anlisis u otros estudios para saber si los valores de sus resultados se consideran dentro de los lmites normales. Los niveles normales de glucemia son los siguientes:  Ayuno: menos de 105mg/dl.  Una hora despus de beber la solucin: menos de 190mg/dl.  Dos horas despus de beber la solucin: menos de 165mg/dl.  Tres horas despus de beber la solucin: menos de 145mg/dl. Algunas sustancias pueden alterar los resultados de la PTG. Estas pueden incluir lo siguiente:  Medicamentos para la presin arterial y la insuficiencia cardaca, como betabloqueantes, furosemida y tiacidas.  Antiinflamatorios, como aspirina.  Nicotina.  Algunos medicamentos psiquitricos. Significado de los resultados que estn fuera de los rangos de los valores normales Los resultados de la PTG por debajo de los valores normales pueden indicar varios problemas de salud, tales como:   Diabetes gestacional.  Respuesta al estrs agudo.  Sndrome de [place].  Tumores como feocromocitoma o glucagonoma.  Problemas renales de larga duracin.  Pancreatitis.  Hipertiroidismo.  Infeccin actual. Hable con el mdico sobre los resultados. El mdico utilizar los resultados para realizar un diagnstico y determinar un plan de tratamiento adecuado para usted.   Esta informacin no tiene como fin reemplazar el consejo del mdico. Asegrese de hacerle al mdico cualquier pregunta que tenga.   Document Released: 03/04/2012 Document Revised: 09/24/2014 Elsevier Interactive Patient Education 2016 Elsevier Inc.  

## 2015-10-05 NOTE — Progress Notes (Signed)
Urine: moderate hgb Breastfeeding tip of the week reviewed

## 2015-10-11 ENCOUNTER — Other Ambulatory Visit: Payer: Medicaid Other

## 2015-10-11 DIAGNOSIS — R7309 Other abnormal glucose: Secondary | ICD-10-CM

## 2015-10-12 LAB — GLUCOSE TOLERANCE, 3 HOURS
GLUCOSE 3 HOUR GTT: 107 mg/dL (ref 70–144)
GLUCOSE, 1 HOUR-GESTATIONAL: 160 mg/dL (ref 70–189)
GLUCOSE, FASTING-GESTATIONAL: 74 mg/dL (ref 65–99)
Glucose Tolerance, 2 hour: 139 mg/dL (ref 70–164)

## 2015-10-20 ENCOUNTER — Ambulatory Visit (INDEPENDENT_AMBULATORY_CARE_PROVIDER_SITE_OTHER): Payer: Medicaid Other | Admitting: Obstetrics and Gynecology

## 2015-10-20 ENCOUNTER — Other Ambulatory Visit (HOSPITAL_COMMUNITY)
Admission: RE | Admit: 2015-10-20 | Discharge: 2015-10-20 | Disposition: A | Discharge status: Home / Self Care | Payer: Medicaid Other | Source: Ambulatory Visit | Attending: Obstetrics and Gynecology | Admitting: Obstetrics and Gynecology

## 2015-10-20 VITALS — BP 114/72 | HR 94 | Temp 98.3°F | Wt 119.0 lb

## 2015-10-20 DIAGNOSIS — Z364 Encounter for antenatal screening for fetal growth retardation: Secondary | ICD-10-CM

## 2015-10-20 DIAGNOSIS — N898 Other specified noninflammatory disorders of vagina: Secondary | ICD-10-CM

## 2015-10-20 DIAGNOSIS — O1210 Gestational proteinuria, unspecified trimester: Secondary | ICD-10-CM

## 2015-10-20 DIAGNOSIS — Z3403 Encounter for supervision of normal first pregnancy, third trimester: Secondary | ICD-10-CM | POA: Diagnosis present

## 2015-10-20 DIAGNOSIS — O1213 Gestational proteinuria, third trimester: Secondary | ICD-10-CM

## 2015-10-20 DIAGNOSIS — Z113 Encounter for screening for infections with a predominantly sexual mode of transmission: Secondary | ICD-10-CM | POA: Insufficient documentation

## 2015-10-20 LAB — POCT URINALYSIS DIP (DEVICE)
Bilirubin Urine: NEGATIVE
GLUCOSE, UA: NEGATIVE mg/dL
Ketones, ur: NEGATIVE mg/dL
LEUKOCYTES UA: NEGATIVE
NITRITE: NEGATIVE
Specific Gravity, Urine: 1.025 (ref 1.005–1.030)
UROBILINOGEN UA: 0.2 mg/dL (ref 0.0–1.0)
pH: 7 (ref 5.0–8.0)

## 2015-10-20 LAB — OB RESULTS CONSOLE GC/CHLAMYDIA: GC PROBE AMP, GENITAL: NEGATIVE

## 2015-10-20 LAB — OB RESULTS CONSOLE GBS: STREP GROUP B AG: NEGATIVE

## 2015-10-20 NOTE — Progress Notes (Signed)
Subjective:  Alison Hooper is a 25 y.o. G1P0 at [redacted]w[redacted]d being seen today for ongoing prenatal care.  She is currently monitored for the following issues for this low-risk pregnancy and has Encounter for supervision of normal first pregnancy and Atopic dermatitis on her problem list.  Patient reports vaginal itching. Sometimes discharge.  Contractions: Not present. Vag. Bleeding: None.  Movement: Present. Denies leaking of fluid.   The following portions of the patient's history were reviewed and updated as appropriate: allergies, current medications, past family history, past medical history, past social history, past surgical history and problem list. Problem list updated.  Objective:   Filed Vitals:   10/20/15 1606  BP: 114/72  Pulse: 94  Temp: 98.3 F (36.8 C)  Weight: 119 lb (53.978 kg)    Fetal Status: Fetal Heart Rate (bpm): 144   Movement: Present     General:  Alert, oriented and cooperative. Patient is in no acute distress.  Skin: Skin is warm and dry. No rash noted.   Cardiovascular: Normal heart rate noted  Respiratory: Normal respiratory effort, no problems with respiration noted  Abdomen: Soft, gravid, appropriate for gestational age. Pain/Pressure: Present     Pelvic: Vag. Bleeding: None Vag D/C Character: White   Cervical exam deferred        Extremities: Normal range of motion.  Edema: Trace  Mental Status: Normal mood and affect. Normal behavior. Normal judgment and thought content.   Urinalysis: Urine Protein: 3+ Urine Glucose: Negative  Assessment and Plan:  Pregnancy: G1P0 at [redacted]w[redacted]d  1. Encounter for supervision of normal first pregnancy in third trimester - POCT urinalysis dip (device) - GC/Chlamydia probe amp ([place])not at [facility] - Culture, beta strep (group b only)  #Size less than dates - ultrasound  # Vaginal discharge - wet prep, g/c  # Proteinuria - noticed in most recent uas. No signs infection, no hypertension, no preeclampsia symptoms.  Will check upc to better characterize. Creatinine normal when checked earlier in pregnancy.  Preterm labor symptoms and general obstetric precautions including but not limited to vaginal bleeding, contractions, leaking of fluid and fetal movement were reviewed in detail with the patient. Please refer to After Visit Summary for other counseling recommendations.  No Follow-up on file.   Kathrynn Running, MD

## 2015-10-20 NOTE — Progress Notes (Signed)
Large amount hemoglobin noted on urinalysis.

## 2015-10-21 ENCOUNTER — Telehealth: Payer: Self-pay | Admitting: Obstetrics and Gynecology

## 2015-10-21 ENCOUNTER — Telehealth: Payer: Self-pay | Admitting: *Deleted

## 2015-10-21 DIAGNOSIS — O1210 Gestational proteinuria, unspecified trimester: Secondary | ICD-10-CM | POA: Insufficient documentation

## 2015-10-21 LAB — PROTEIN / CREATININE RATIO, URINE
Creatinine, Urine: 273 mg/dL (ref 20–320)
PROTEIN CREATININE RATIO: 1018 mg/g{creat} — AB (ref 21–161)
Total Protein, Urine: 278 mg/dL — ABNORMAL HIGH (ref 5–24)

## 2015-10-21 LAB — WET PREP, GENITAL: TRICH WET PREP: NONE SEEN

## 2015-10-21 LAB — GC/CHLAMYDIA PROBE AMP (~~LOC~~) NOT AT ARMC
CHLAMYDIA, DNA PROBE: NEGATIVE
Neisseria Gonorrhea: NEGATIVE

## 2015-10-21 MED ORDER — METRONIDAZOLE 500 MG PO TABS: 500.0000 mg | ORAL_TABLET | Freq: Two times a day (BID) | ORAL | Status: DC

## 2015-10-21 NOTE — Telephone Encounter (Addendum)
Called pt w/[facility] # [phone] and informed pt that it is important for her to have additional lab tests due to increased level of protein in her urine.  Pt was requested to come to clinic on 2/6 following her [place] for these tests. She voiced understanding and agreed to request.   Pt informed on a subsequent call that her wet prep results show +BV. A prescription has been sent to her pharmacy.  Pt voiced understanding.

## 2015-10-21 NOTE — Telephone Encounter (Signed)
Review of UAs this pregnancy shows persistent proteinuria. UPC collected at prenatal visit is markedly elevated (1.018). No elevated BPs and denied preE symptoms at visit. Having nursing contact to arrange for 24-hour urine protein collection and preeclampsia labs and preeclampsia precautions. Normal Cr when last checked. If not preeclampsia this could represent nephrotic syndrome, which would warrant close nephrology f/u postpartum.

## 2015-10-21 NOTE — Addendum Note (Signed)
Addended by: Shonna Chock B on: 10/21/2015 09:50 AM   Modules accepted: Orders

## 2015-10-22 LAB — CULTURE, BETA STREP (GROUP B ONLY)

## 2015-10-24 ENCOUNTER — Other Ambulatory Visit: Payer: Medicaid Other

## 2015-10-24 ENCOUNTER — Other Ambulatory Visit: Payer: Self-pay | Admitting: Obstetrics and Gynecology

## 2015-10-24 ENCOUNTER — Ambulatory Visit (HOSPITAL_COMMUNITY)
Admission: RE | Admit: 2015-10-24 | Discharge: 2015-10-24 | Disposition: A | Discharge status: Home / Self Care | Payer: Medicaid Other | Source: Ambulatory Visit | Attending: Obstetrics and Gynecology | Admitting: Obstetrics and Gynecology

## 2015-10-24 DIAGNOSIS — O26843 Uterine size-date discrepancy, third trimester: Secondary | ICD-10-CM

## 2015-10-24 DIAGNOSIS — Z3A37 37 weeks gestation of pregnancy: Secondary | ICD-10-CM

## 2015-10-24 DIAGNOSIS — Z36 Encounter for antenatal screening of mother: Secondary | ICD-10-CM | POA: Insufficient documentation

## 2015-10-24 DIAGNOSIS — Z364 Encounter for antenatal screening for fetal growth retardation: Secondary | ICD-10-CM

## 2015-10-24 DIAGNOSIS — Z3403 Encounter for supervision of normal first pregnancy, third trimester: Secondary | ICD-10-CM

## 2015-10-26 ENCOUNTER — Other Ambulatory Visit: Payer: Medicaid Other

## 2015-10-26 LAB — CBC
HCT: 34.3 % — ABNORMAL LOW (ref 36.0–46.0)
HEMOGLOBIN: 11.3 g/dL — AB (ref 12.0–15.0)
MCH: 28 pg (ref 26.0–34.0)
MCHC: 32.9 g/dL (ref 30.0–36.0)
MCV: 85.1 fL (ref 78.0–100.0)
MPV: 10.3 fL (ref 8.6–12.4)
PLATELETS: 311 10*3/uL (ref 150–400)
RBC: 4.03 MIL/uL (ref 3.87–5.11)
RDW: 13.6 % (ref 11.5–15.5)
WBC: 11 10*3/uL — ABNORMAL HIGH (ref 4.0–10.5)

## 2015-10-26 LAB — COMPREHENSIVE METABOLIC PANEL
ALBUMIN: 2.7 g/dL — AB (ref 3.6–5.1)
ALT: 7 U/L (ref 6–29)
AST: 15 U/L (ref 10–30)
Alkaline Phosphatase: 145 U/L — ABNORMAL HIGH (ref 33–115)
BILIRUBIN TOTAL: 0.3 mg/dL (ref 0.2–1.2)
BUN: 7 mg/dL (ref 7–25)
CALCIUM: 8.4 mg/dL — AB (ref 8.6–10.2)
CHLORIDE: 102 mmol/L (ref 98–110)
CO2: 21 mmol/L (ref 20–31)
Creat: 0.62 mg/dL (ref 0.50–1.10)
Glucose, Bld: 70 mg/dL (ref 65–99)
POTASSIUM: 4.5 mmol/L (ref 3.5–5.3)
Sodium: 134 mmol/L — ABNORMAL LOW (ref 135–146)
Total Protein: 6.1 g/dL (ref 6.1–8.1)

## 2015-10-26 LAB — ALBUMIN: Albumin: 2.7 g/dL — ABNORMAL LOW (ref 3.6–5.1)

## 2015-10-27 ENCOUNTER — Ambulatory Visit (INDEPENDENT_AMBULATORY_CARE_PROVIDER_SITE_OTHER): Payer: Medicaid Other | Admitting: Advanced Practice Midwife

## 2015-10-27 VITALS — BP 125/79 | HR 86 | Temp 98.3°F | Wt 120.9 lb

## 2015-10-27 DIAGNOSIS — O1210 Gestational proteinuria, unspecified trimester: Secondary | ICD-10-CM

## 2015-10-27 DIAGNOSIS — Z3483 Encounter for supervision of other normal pregnancy, third trimester: Secondary | ICD-10-CM

## 2015-10-27 DIAGNOSIS — O1213 Gestational proteinuria, third trimester: Secondary | ICD-10-CM

## 2015-10-27 LAB — POCT URINALYSIS DIP (DEVICE)
Bilirubin Urine: NEGATIVE
Glucose, UA: NEGATIVE mg/dL
Ketones, ur: NEGATIVE mg/dL
NITRITE: NEGATIVE
PH: 7 (ref 5.0–8.0)
Specific Gravity, Urine: 1.025 (ref 1.005–1.030)
UROBILINOGEN UA: 0.2 mg/dL (ref 0.0–1.0)

## 2015-10-27 MED ORDER — FOLIC ACID 1 MG PO TABS: 1.0000 mg | ORAL_TABLET | Freq: Every day | ORAL | Status: DC

## 2015-10-27 NOTE — Progress Notes (Signed)
Subjective:  Alison Hooper is a 25 y.o. G1P0 at [redacted]w[redacted]d being seen today for ongoing prenatal care.  She is currently monitored for the following issues for this low-risk pregnancy and has Encounter for supervision of normal first pregnancy; Atopic dermatitis; Ultrasound for antenatal screening for fetal growth restriction; and Proteinuria affecting pregnancy on her problem list.  Patient reports no complaints.  Contractions: Not present. Vag. Bleeding: None.  Movement: Present. Denies leaking of fluid.   The following portions of the patient's history were reviewed and updated as appropriate: allergies, current medications, past family history, past medical history, past social history, past surgical history and problem list. Problem list updated.  Objective:   Filed Vitals:   10/27/15 1420  BP: 125/79  Pulse: 86  Temp: 98.3 F (36.8 C)  Weight: 120 lb 14.4 oz (54.84 kg)    Fetal Status: Fetal Heart Rate (bpm): 135   Movement: Present     General:  Alert, oriented and cooperative. Patient is in no acute distress.  Skin: Skin is warm and dry. No rash noted.   Cardiovascular: Normal heart rate noted  Respiratory: Normal respiratory effort, no problems with respiration noted  Abdomen: Soft, gravid, appropriate for gestational age. Pain/Pressure: Present     Pelvic: Vag. Bleeding: None     Cervical exam deferred        Extremities: Normal range of motion.  Edema: None  Mental Status: Normal mood and affect. Normal behavior. Normal judgment and thought content.   Urinalysis:      Assessment and Plan:  Pregnancy: G1P0 at [redacted]w[redacted]d  1. Proteinuria affecting pregnancy --24 hour urine results pending, dropped off 2/8.    Term labor symptoms and general obstetric precautions including but not limited to vaginal bleeding, contractions, leaking of fluid and fetal movement were reviewed in detail with the patient. Please refer to After Visit Summary for other counseling recommendations.   Return in about 1 week (around 11/03/2015).   Hurshel Party, CNM

## 2015-10-27 NOTE — Progress Notes (Signed)
Breastfeeding tip of the week reviewed. 

## 2015-10-29 LAB — PROTEIN, URINE, 24 HOUR
Protein, 24H Urine: 1429 mg/24 h — ABNORMAL HIGH (ref <150)
Protein, Urine: 62 mg/dL — ABNORMAL HIGH (ref 5–24)

## 2015-10-29 LAB — CREATININE CLEARANCE, URINE, 24 HOUR
CREAT CLEAR: 124 mL/min — AB (ref 75–115)
CREATININE, URINE: 48 mg/dL (ref 20–320)
CREATININE: 0.62 mg/dL (ref 0.50–1.10)
Creatinine, 24H Ur: 1.11 g/(24.h) (ref 0.63–2.50)

## 2015-11-02 ENCOUNTER — Ambulatory Visit (INDEPENDENT_AMBULATORY_CARE_PROVIDER_SITE_OTHER): Payer: Medicaid Other | Admitting: Advanced Practice Midwife

## 2015-11-02 VITALS — BP 119/78 | HR 82 | Wt 119.3 lb

## 2015-11-02 DIAGNOSIS — O1210 Gestational proteinuria, unspecified trimester: Secondary | ICD-10-CM

## 2015-11-02 DIAGNOSIS — O1213 Gestational proteinuria, third trimester: Secondary | ICD-10-CM

## 2015-11-02 DIAGNOSIS — Z3403 Encounter for supervision of normal first pregnancy, third trimester: Secondary | ICD-10-CM

## 2015-11-02 LAB — POCT URINALYSIS DIP (DEVICE)
Bilirubin Urine: NEGATIVE
GLUCOSE, UA: NEGATIVE mg/dL
KETONES UR: NEGATIVE mg/dL
Nitrite: NEGATIVE
Protein, ur: >=300 mg/dL — AB
SPECIFIC GRAVITY, URINE: 1.02 (ref 1.005–1.030)
UROBILINOGEN UA: 0.2 mg/dL (ref 0.0–1.0)
pH: 7 (ref 5.0–8.0)

## 2015-11-02 NOTE — Patient Instructions (Signed)
Labor Precautions Reasons to come to [facility]:  1.  Contractions are  5 minutes apart or less, each last 1 minute, these have been going on for 1-2 hours, and you cannot walk or talk during them 2.  You have a large gush of fluid, or a trickle of fluid that will not stop and you have to wear a pad 3.  You have bleeding that is bright red, heavier than spotting--like menstrual bleeding (spotting can be normal in early labor or after a check of your cervix) 4.  You do not feel the baby moving like he/she normally does Third Trimester of Pregnancy The third trimester is from week 29 through week 42, months 7 through 9. The third trimester is a time when the fetus is growing rapidly. At the end of the ninth month, the fetus is about 20 inches in length and weighs 6-10 pounds.  BODY CHANGES Your body goes through many changes during pregnancy. The changes vary from woman to woman.   Your weight will continue to increase. You can expect to gain 25-35 pounds (11-16 kg) by the end of the pregnancy.  You may begin to get stretch marks on your hips, abdomen, and breasts.  You may urinate more often because the fetus is moving lower into your pelvis and pressing on your bladder.  You may develop or continue to have heartburn as a result of your pregnancy.  You may develop constipation because certain hormones are causing the muscles that push waste through your intestines to slow down.  You may develop hemorrhoids or swollen, bulging veins (varicose veins).  You may have pelvic pain because of the weight gain and pregnancy hormones relaxing your joints between the bones in your pelvis. Backaches may result from overexertion of the muscles supporting your posture.  You may have changes in your hair. These can include thickening of your hair, rapid growth, and changes in texture. Some women also have hair loss during or after pregnancy, or hair that feels dry or thin. Your hair will most likely return to  normal after your baby is born.  Your breasts will continue to grow and be tender. A yellow discharge may leak from your breasts called colostrum.  Your belly button may stick out.  You may feel short of breath because of your expanding uterus.  You may notice the fetus "dropping," or moving lower in your abdomen.  You may have a bloody mucus discharge. This usually occurs a few days to a week before labor begins.  Your cervix becomes thin and soft (effaced) near your due date. WHAT TO EXPECT AT YOUR PRENATAL EXAMS  You will have prenatal exams every 2 weeks until week 36. Then, you will have weekly prenatal exams. During a routine prenatal visit:  You will be weighed to make sure you and the fetus are growing normally.  Your blood pressure is taken.  Your abdomen will be measured to track your baby's growth.  The fetal heartbeat will be listened to.  Any test results from the previous visit will be discussed.  You may have a cervical check near your due date to see if you have effaced. At around 36 weeks, your caregiver will check your cervix. At the same time, your caregiver will also perform a test on the secretions of the vaginal tissue. This test is to determine if a type of bacteria, Group B streptococcus, is present. Your caregiver will explain this further. Your caregiver may ask you:  What   your birth plan is.  How you are feeling.  If you are feeling the baby move.  If you have had any abnormal symptoms, such as leaking fluid, bleeding, severe headaches, or abdominal cramping.  If you are using any tobacco products, including cigarettes, chewing tobacco, and electronic cigarettes.  If you have any questions. Other tests or screenings that may be performed during your third trimester include:  Blood tests that check for low iron levels (anemia).  Fetal testing to check the health, activity level, and growth of the fetus. Testing is done if you have certain medical  conditions or if there are problems during the pregnancy.  HIV (human immunodeficiency virus) testing. If you are at high risk, you may be screened for HIV during your third trimester of pregnancy. FALSE LABOR You may feel small, irregular contractions that eventually go away. These are called Braxton Hicks contractions, or false labor. Contractions may last for hours, days, or even weeks before true labor sets in. If contractions come at regular intervals, intensify, or become painful, it is best to be seen by your caregiver.  SIGNS OF LABOR   Menstrual-like cramps.  Contractions that are 5 minutes apart or less.  Contractions that start on the top of the uterus and spread down to the lower abdomen and back.  A sense of increased pelvic pressure or back pain.  A watery or bloody mucus discharge that comes from the vagina. If you have any of these signs before the 37th week of pregnancy, call your caregiver right away. You need to go to the hospital to get checked immediately. HOME CARE INSTRUCTIONS   Avoid all smoking, herbs, alcohol, and unprescribed drugs. These chemicals affect the formation and growth of the baby.  Do not use any tobacco products, including cigarettes, chewing tobacco, and electronic cigarettes. If you need help quitting, ask your health care provider. You may receive counseling support and other resources to help you quit.  Follow your caregiver's instructions regarding medicine use. There are medicines that are either safe or unsafe to take during pregnancy.  Exercise only as directed by your caregiver. Experiencing uterine cramps is a good sign to stop exercising.  Continue to eat regular, healthy meals.  Wear a good support bra for breast tenderness.  Do not use hot tubs, steam rooms, or saunas.  Wear your seat belt at all times when driving.  Avoid raw meat, uncooked cheese, cat litter boxes, and soil used by cats. These carry germs that can cause birth  defects in the baby.  Take your prenatal vitamins.  Take 1500-2000 mg of calcium daily starting at the 20th week of pregnancy until you deliver your baby.  Try taking a stool softener (if your caregiver approves) if you develop constipation. Eat more high-fiber foods, such as fresh vegetables or fruit and whole grains. Drink plenty of fluids to keep your urine clear or pale yellow.  Take warm sitz baths to soothe any pain or discomfort caused by hemorrhoids. Use hemorrhoid cream if your caregiver approves.  If you develop varicose veins, wear support hose. Elevate your feet for 15 minutes, 3-4 times a day. Limit salt in your diet.  Avoid heavy lifting, wear low heal shoes, and practice good posture.  Rest a lot with your legs elevated if you have leg cramps or low back pain.  Visit your dentist if you have not gone during your pregnancy. Use a soft toothbrush to brush your teeth and be gentle when you floss.    A sexual relationship may be continued unless your caregiver directs you otherwise.  Do not travel far distances unless it is absolutely necessary and only with the approval of your caregiver.  Take prenatal classes to understand, practice, and ask questions about the labor and delivery.  Make a trial run to the hospital.  Pack your hospital bag.  Prepare the baby's nursery.  Continue to go to all your prenatal visits as directed by your caregiver. SEEK MEDICAL CARE IF:  You are unsure if you are in labor or if your water has broken.  You have dizziness.  You have mild pelvic cramps, pelvic pressure, or nagging pain in your abdominal area.  You have persistent nausea, vomiting, or diarrhea.  You have a bad smelling vaginal discharge.  You have pain with urination. SEEK IMMEDIATE MEDICAL CARE IF:   You have a fever.  You are leaking fluid from your vagina.  You have spotting or bleeding from your vagina.  You have severe abdominal cramping or pain.  You have  rapid weight loss or gain.  You have shortness of breath with chest pain.  You notice sudden or extreme swelling of your face, hands, ankles, feet, or legs.  You have not felt your baby move in over an hour.  You have severe headaches that do not go away with medicine.  You have vision changes.   This information is not intended to replace advice given to you by your health care provider. Make sure you discuss any questions you have with your health care provider.   Document Released: 08/28/2001 Document Revised: 09/24/2014 Document Reviewed: 11/04/2012 Elsevier Interactive Patient Education 2016 [employer]  

## 2015-11-02 NOTE — Progress Notes (Signed)
Subjective:  Alison Hooper is a 25 y.o. G1P0 at [redacted]w[redacted]d being seen today for ongoing prenatal care.  She is currently monitored for the following issues for this low-risk pregnancy and has Encounter for supervision of normal first pregnancy; Atopic dermatitis; Ultrasound for antenatal screening for fetal growth restriction; and Proteinuria affecting pregnancy on her problem list.  Patient reports no complaints.  Contractions: Not present. Vag. Bleeding: None.  Movement: Present. Denies leaking of fluid.   The following portions of the patient's history were reviewed and updated as appropriate: allergies, current medications, past family history, past medical history, past social history, past surgical history and problem list. Problem list updated.  Objective:   Filed Vitals:   11/02/15 0805  BP: 119/78  Pulse: 82  Weight: 119 lb 4.8 oz (54.114 kg)    Fetal Status: Fetal Heart Rate (bpm): 140 Fundal Height: 35 cm Movement: Present     General:  Alert, oriented and cooperative. Patient is in no acute distress.  Skin: Skin is warm and dry. No rash noted.   Cardiovascular: Normal heart rate noted  Respiratory: Normal respiratory effort, no problems with respiration noted  Abdomen: Soft, gravid, appropriate for gestational age. Pain/Pressure: Present     Pelvic: Vag. Bleeding: None     Cervical exam deferred        Extremities: Normal range of motion.  Edema: None  Mental Status: Normal mood and affect. Normal behavior. Normal judgment and thought content.   Urinalysis: Urine Protein: 3+ Urine Glucose: Negative  Assessment and Plan:  Pregnancy: G1P0 at [redacted]w[redacted]d  1. Proteinuria affecting pregnancy --Plan for [facility] nephrology  2. Encounter for supervision of normal first pregnancy in third trimester   Term labor symptoms and general obstetric precautions including but not limited to vaginal bleeding, contractions, leaking of fluid and fetal movement were reviewed in detail with the  patient. Please refer to After Visit Summary for other counseling recommendations.  Return in about 1 week (around 11/09/2015).   Hurshel Party, CNM

## 2015-11-09 ENCOUNTER — Ambulatory Visit (INDEPENDENT_AMBULATORY_CARE_PROVIDER_SITE_OTHER): Payer: Medicaid Other | Admitting: Student

## 2015-11-09 VITALS — BP 117/82 | HR 86 | Temp 97.8°F | Wt 121.6 lb

## 2015-11-09 DIAGNOSIS — Z3403 Encounter for supervision of normal first pregnancy, third trimester: Secondary | ICD-10-CM | POA: Diagnosis present

## 2015-11-09 LAB — POCT URINALYSIS DIP (DEVICE)
GLUCOSE, UA: NEGATIVE mg/dL
Ketones, ur: NEGATIVE mg/dL
NITRITE: NEGATIVE
PH: 7 (ref 5.0–8.0)
Protein, ur: >=300 mg/dL — AB
Specific Gravity, Urine: 1.025 (ref 1.005–1.030)
Urobilinogen, UA: 0.2 mg/dL (ref 0.0–1.0)

## 2015-11-09 NOTE — Patient Instructions (Signed)
Braxton Hicks Contractions °Contractions of the uterus can occur throughout pregnancy. Contractions are not always a sign that you are in labor.  °WHAT ARE BRAXTON HICKS CONTRACTIONS?  °Contractions that occur before labor are called Braxton Hicks contractions, or false labor. Toward the end of pregnancy (32-34 weeks), these contractions can develop more often and may become more forceful. This is not true labor because these contractions do not result in opening (dilatation) and thinning of the cervix. They are sometimes difficult to tell apart from true labor because these contractions can be forceful and people have different pain tolerances. You should not feel embarrassed if you go to the hospital with false labor. Sometimes, the only way to tell if you are in true labor is for your health care provider to look for changes in the cervix. °If there are no prenatal problems or other health problems associated with the pregnancy, it is completely safe to be sent home with false labor and await the onset of true labor. °HOW CAN YOU TELL THE DIFFERENCE BETWEEN TRUE AND FALSE LABOR? °False Labor °· The contractions of false labor are usually shorter and not as hard as those of true labor.   °· The contractions are usually irregular.   °· The contractions are often felt in the front of the lower abdomen and in the groin.   °· The contractions may go away when you walk around or change positions while lying down.   °· The contractions get weaker and are shorter lasting as time goes on.   °· The contractions do not usually become progressively stronger, regular, and closer together as with true labor.   °True Labor °· Contractions in true labor last 30-70 seconds, become very regular, usually become more intense, and increase in frequency.   °· The contractions do not go away with walking.   °· The discomfort is usually felt in the top of the uterus and spreads to the lower abdomen and low back.   °· True labor can be  determined by your health care provider with an exam. This will show that the cervix is dilating and getting thinner.   °WHAT TO REMEMBER °· Keep up with your usual exercises and follow other instructions given by your health care provider.   °· Take medicines as directed by your health care provider.   °· Keep your regular prenatal appointments.   °· Eat and drink lightly if you think you are going into labor.   °· If Braxton Hicks contractions are making you uncomfortable:   °¨ Change your position from lying down or resting to walking, or from walking to resting.   °¨ Sit and rest in a tub of warm water.   °¨ Drink 2-3 glasses of water. Dehydration may cause these contractions.   °¨ Do slow and deep breathing several times an hour.   °WHEN SHOULD I SEEK IMMEDIATE MEDICAL CARE? °Seek immediate medical care if: °· Your contractions become stronger, more regular, and closer together.   °· You have fluid leaking or gushing from your vagina.   °· You have a fever.   °· You pass blood-tinged mucus.   °· You have vaginal bleeding.   °· You have continuous abdominal pain.   °· You have low back pain that you never had before.   °· You feel your baby's head pushing down and causing pelvic pressure.   °· Your baby is not moving as much as it used to.   °  °This information is not intended to replace advice given to you by your health care provider. Make sure you discuss any questions you have with your health care   provider. °  °Document Released: 09/03/2005 Document Revised: 09/08/2013 Document Reviewed: 06/15/2013 °Elsevier Interactive Patient Education ©2016 [employer] °Fetal Movement Counts °Patient Name: __________________________________________________ Patient Due Date: ____________________ °Performing a fetal movement count is highly recommended in high-risk pregnancies, but it is good for every pregnant woman to do. Your health care provider may ask you to start counting fetal movements at 28 weeks of the  pregnancy. Fetal movements often increase: °· After eating a full meal. °· After physical activity. °· After eating or drinking something sweet or cold. °· At rest. °Pay attention to when you feel the baby is most active. This will help you notice a pattern of your baby's sleep and wake cycles and what factors contribute to an increase in fetal movement. It is important to perform a fetal movement count at the same time each day when your baby is normally most active.  °HOW TO COUNT FETAL MOVEMENTS °· Find a quiet and comfortable area to sit or lie down on your left side. Lying on your left side provides the best blood and oxygen circulation to your baby. °· Write down the day and time on a sheet of paper or in a journal. °· Start counting kicks, flutters, swishes, rolls, or jabs in a 2-hour period. You should feel at least 10 movements within 2 hours. °· If you do not feel 10 movements in 2 hours, wait 2-3 hours and count again. Look for a change in the pattern or not enough counts in 2 hours. °SEEK MEDICAL CARE IF: °· You feel less than 10 counts in 2 hours, tried twice. °· There is no movement in over an hour. °· The pattern is changing or taking longer each day to reach 10 counts in 2 hours. °· You feel the baby is not moving as he or she usually does. °Date: ____________ Movements: ____________ Start time: ____________ Finish time: ____________  °Date: ____________ Movements: ____________ Start time: ____________ Finish time: ____________ °Date: ____________ Movements: ____________ Start time: ____________ Finish time: ____________ °Date: ____________ Movements: ____________ Start time: ____________ Finish time: ____________ °Date: ____________ Movements: ____________ Start time: ____________ Finish time: ____________ °Date: ____________ Movements: ____________ Start time: ____________ Finish time: ____________ °Date: ____________ Movements: ____________ Start time: ____________ Finish time: ____________ °Date:  ____________ Movements: ____________ Start time: ____________ Finish time: ____________  °Date: ____________ Movements: ____________ Start time: ____________ Finish time: ____________ °Date: ____________ Movements: ____________ Start time: ____________ Finish time: ____________ °Date: ____________ Movements: ____________ Start time: ____________ Finish time: ____________ °Date: ____________ Movements: ____________ Start time: ____________ Finish time: ____________ °Date: ____________ Movements: ____________ Start time: ____________ Finish time: ____________ °Date: ____________ Movements: ____________ Start time: ____________ Finish time: ____________ °Date: ____________ Movements: ____________ Start time: ____________ Finish time: ____________  °Date: ____________ Movements: ____________ Start time: ____________ Finish time: ____________ °Date: ____________ Movements: ____________ Start time: ____________ Finish time: ____________ °Date: ____________ Movements: ____________ Start time: ____________ Finish time: ____________ °Date: ____________ Movements: ____________ Start time: ____________ Finish time: ____________ °Date: ____________ Movements: ____________ Start time: ____________ Finish time: ____________ °Date: ____________ Movements: ____________ Start time: ____________ Finish time: ____________ °Date: ____________ Movements: ____________ Start time: ____________ Finish time: ____________  °Date: ____________ Movements: ____________ Start time: ____________ Finish time: ____________ °Date: ____________ Movements: ____________ Start time: ____________ Finish time: ____________ °Date: ____________ Movements: ____________ Start time: ____________ Finish time: ____________ °Date: ____________ Movements: ____________ Start time: ____________ Finish time: ____________ °Date: ____________ Movements: ____________ Start time: ____________ Finish time: ____________ °Date: ____________ Movements: ____________ Start  time: ____________ Finish time:   ____________ °Date: ____________ Movements: ____________ Start time: ____________ Finish time: ____________  °Date: ____________ Movements: ____________ Start time: ____________ Finish time: ____________ °Date: ____________ Movements: ____________ Start time: ____________ Finish time: ____________ °Date: ____________ Movements: ____________ Start time: ____________ Finish time: ____________ °Date: ____________ Movements: ____________ Start time: ____________ Finish time: ____________ °Date: ____________ Movements: ____________ Start time: ____________ Finish time: ____________ °Date: ____________ Movements: ____________ Start time: ____________ Finish time: ____________ °Date: ____________ Movements: ____________ Start time: ____________ Finish time: ____________  °Date: ____________ Movements: ____________ Start time: ____________ Finish time: ____________ °Date: ____________ Movements: ____________ Start time: ____________ Finish time: ____________ °Date: ____________ Movements: ____________ Start time: ____________ Finish time: ____________ °Date: ____________ Movements: ____________ Start time: ____________ Finish time: ____________ °Date: ____________ Movements: ____________ Start time: ____________ Finish time: ____________ °Date: ____________ Movements: ____________ Start time: ____________ Finish time: ____________ °Date: ____________ Movements: ____________ Start time: ____________ Finish time: ____________  °Date: ____________ Movements: ____________ Start time: ____________ Finish time: ____________ °Date: ____________ Movements: ____________ Start time: ____________ Finish time: ____________ °Date: ____________ Movements: ____________ Start time: ____________ Finish time: ____________ °Date: ____________ Movements: ____________ Start time: ____________ Finish time: ____________ °Date: ____________ Movements: ____________ Start time: ____________ Finish time:  ____________ °Date: ____________ Movements: ____________ Start time: ____________ Finish time: ____________ °Date: ____________ Movements: ____________ Start time: ____________ Finish time: ____________  °Date: ____________ Movements: ____________ Start time: ____________ Finish time: ____________ °Date: ____________ Movements: ____________ Start time: ____________ Finish time: ____________ °Date: ____________ Movements: ____________ Start time: ____________ Finish time: ____________ °Date: ____________ Movements: ____________ Start time: ____________ Finish time: ____________ °Date: ____________ Movements: ____________ Start time: ____________ Finish time: ____________ °Date: ____________ Movements: ____________ Start time: ____________ Finish time: ____________ °  °This information is not intended to replace advice given to you by your health care provider. Make sure you discuss any questions you have with your health care provider. °  °Document Released: 10/03/2006 Document Revised: 09/24/2014 Document Reviewed: 06/30/2012 °Elsevier Interactive Patient Education ©2016 [employer] ° °

## 2015-11-09 NOTE — Progress Notes (Signed)
Breastfeeding tip of the week reviewed. 

## 2015-11-09 NOTE — Progress Notes (Signed)
Subjective:  Alison Hooper is a 25 y.o. G1P0 at [redacted]w[redacted]d being seen today for ongoing prenatal care.  She is currently monitored for the following issues for this low-risk pregnancy and has Encounter for supervision of normal first pregnancy; Atopic dermatitis; Ultrasound for antenatal screening for fetal growth restriction; and Proteinuria affecting pregnancy on her problem list.  Patient reports occasional contractions.  Contractions: Irritability. Vag. Bleeding: None.  Movement: Present. Denies leaking of fluid.   The following portions of the patient's history were reviewed and updated as appropriate: allergies, current medications, past family history, past medical history, past social history, past surgical history and problem list. Problem list updated.  Objective:   Filed Vitals:   11/09/15 1116  BP: 117/82  Pulse: 86  Temp: 97.8 F (36.6 C)  Weight: 121 lb 9.6 oz (55.157 kg)    Fetal Status: Fetal Heart Rate (bpm): 145   Movement: Present     General:  Alert, oriented and cooperative. Patient is in no acute distress.  Skin: Skin is warm and dry. No rash noted.   Cardiovascular: Normal heart rate noted  Respiratory: Normal respiratory effort, no problems with respiration noted  Abdomen: Soft, gravid, appropriate for gestational age. Pain/Pressure: Present     Fundal height 37 cm  Pelvic: Vag. Bleeding: None     Cervical exam performed         3/50/-3, posterior, vertex  Extremities: Normal range of motion.  Edema: None  Mental Status: Normal mood and affect. Normal behavior. Normal judgment and thought content.   Urinalysis: Urine Protein: 3+ Urine Glucose: Negative  Assessment and Plan:  Pregnancy: G1P0 at [redacted]w[redacted]d  1. Encounter for supervision of normal first pregnancy in third trimester   Term labor symptoms and general obstetric precautions including but not limited to vaginal bleeding, contractions, leaking of fluid and fetal movement were reviewed in detail with the  patient. Please refer to After Visit Summary for other counseling recommendations.  Return in about 2 days (around 11/11/2015) for NST/AFI.   Judeth Horn, NP

## 2015-11-10 ENCOUNTER — Encounter (HOSPITAL_COMMUNITY): Payer: Self-pay

## 2015-11-10 ENCOUNTER — Inpatient Hospital Stay (HOSPITAL_COMMUNITY)
Admission: AD | Admit: 2015-11-10 | Discharge: 2015-11-10 | Disposition: A | Discharge status: Home / Self Care | Payer: Medicaid Other | Source: Ambulatory Visit | Attending: Obstetrics & Gynecology | Admitting: Obstetrics & Gynecology

## 2015-11-10 DIAGNOSIS — Z3403 Encounter for supervision of normal first pregnancy, third trimester: Secondary | ICD-10-CM

## 2015-11-10 NOTE — MAU Note (Signed)
Started at 0230, getting closer and stronger.  Started having some bloody show. Was 3 cm yesterday.

## 2015-11-10 NOTE — MAU Note (Signed)
Notified provider that patient is 4/50/-3. Patient is not very uncomfortable. Provider said she could ambulate for an hour then be rechecked.

## 2015-11-10 NOTE — Discharge Instructions (Signed)
Braxton Hicks Contractions °Contractions of the uterus can occur throughout pregnancy. Contractions are not always a sign that you are in labor.  °WHAT ARE BRAXTON HICKS CONTRACTIONS?  °Contractions that occur before labor are called Braxton Hicks contractions, or false labor. Toward the end of pregnancy (32-34 weeks), these contractions can develop more often and may become more forceful. This is not true labor because these contractions do not result in opening (dilatation) and thinning of the cervix. They are sometimes difficult to tell apart from true labor because these contractions can be forceful and people have different pain tolerances. You should not feel embarrassed if you go to the hospital with false labor. Sometimes, the only way to tell if you are in true labor is for your health care provider to look for changes in the cervix. °If there are no prenatal problems or other health problems associated with the pregnancy, it is completely safe to be sent home with false labor and await the onset of true labor. °HOW CAN YOU TELL THE DIFFERENCE BETWEEN TRUE AND FALSE LABOR? °False Labor °· The contractions of false labor are usually shorter and not as hard as those of true labor.   °· The contractions are usually irregular.   °· The contractions are often felt in the front of the lower abdomen and in the groin.   °· The contractions may go away when you walk around or change positions while lying down.   °· The contractions get weaker and are shorter lasting as time goes on.   °· The contractions do not usually become progressively stronger, regular, and closer together as with true labor.   °True Labor °· Contractions in true labor last 30-70 seconds, become very regular, usually become more intense, and increase in frequency.   °· The contractions do not go away with walking.   °· The discomfort is usually felt in the top of the uterus and spreads to the lower abdomen and low back.   °· True labor can be  determined by your health care provider with an exam. This will show that the cervix is dilating and getting thinner.   °WHAT TO REMEMBER °· Keep up with your usual exercises and follow other instructions given by your health care provider.   °· Take medicines as directed by your health care provider.   °· Keep your regular prenatal appointments.   °· Eat and drink lightly if you think you are going into labor.   °· If Braxton Hicks contractions are making you uncomfortable:   °¨ Change your position from lying down or resting to walking, or from walking to resting.   °¨ Sit and rest in a tub of warm water.   °¨ Drink 2-3 glasses of water. Dehydration may cause these contractions.   °¨ Do slow and deep breathing several times an hour.   °WHEN SHOULD I SEEK IMMEDIATE MEDICAL CARE? °Seek immediate medical care if: °· Your contractions become stronger, more regular, and closer together.   °· You have fluid leaking or gushing from your vagina.   °· You have a fever.   °· You pass blood-tinged mucus.   °· You have vaginal bleeding.   °· You have continuous abdominal pain.   °· You have low back pain that you never had before.   °· You feel your baby's head pushing down and causing pelvic pressure.   °· Your baby is not moving as much as it used to.   °  °This information is not intended to replace advice given to you by your health care provider. Make sure you discuss any questions you have with your health care   provider. °  °Document Released: 09/03/2005 Document Revised: 09/08/2013 Document Reviewed: 06/15/2013 °Elsevier Interactive Patient Education ©2016 [employer] °Fetal Movement Counts °Patient Name: __________________________________________________ Patient Due Date: ____________________ °Performing a fetal movement count is highly recommended in high-risk pregnancies, but it is good for every pregnant woman to do. Your health care provider may ask you to start counting fetal movements at 28 weeks of the  pregnancy. Fetal movements often increase: °· After eating a full meal. °· After physical activity. °· After eating or drinking something sweet or cold. °· At rest. °Pay attention to when you feel the baby is most active. This will help you notice a pattern of your baby's sleep and wake cycles and what factors contribute to an increase in fetal movement. It is important to perform a fetal movement count at the same time each day when your baby is normally most active.  °HOW TO COUNT FETAL MOVEMENTS °· Find a quiet and comfortable area to sit or lie down on your left side. Lying on your left side provides the best blood and oxygen circulation to your baby. °· Write down the day and time on a sheet of paper or in a journal. °· Start counting kicks, flutters, swishes, rolls, or jabs in a 2-hour period. You should feel at least 10 movements within 2 hours. °· If you do not feel 10 movements in 2 hours, wait 2-3 hours and count again. Look for a change in the pattern or not enough counts in 2 hours. °SEEK MEDICAL CARE IF: °· You feel less than 10 counts in 2 hours, tried twice. °· There is no movement in over an hour. °· The pattern is changing or taking longer each day to reach 10 counts in 2 hours. °· You feel the baby is not moving as he or she usually does. °Date: ____________ Movements: ____________ Start time: ____________ Finish time: ____________  °Date: ____________ Movements: ____________ Start time: ____________ Finish time: ____________ °Date: ____________ Movements: ____________ Start time: ____________ Finish time: ____________ °Date: ____________ Movements: ____________ Start time: ____________ Finish time: ____________ °Date: ____________ Movements: ____________ Start time: ____________ Finish time: ____________ °Date: ____________ Movements: ____________ Start time: ____________ Finish time: ____________ °Date: ____________ Movements: ____________ Start time: ____________ Finish time: ____________ °Date:  ____________ Movements: ____________ Start time: ____________ Finish time: ____________  °Date: ____________ Movements: ____________ Start time: ____________ Finish time: ____________ °Date: ____________ Movements: ____________ Start time: ____________ Finish time: ____________ °Date: ____________ Movements: ____________ Start time: ____________ Finish time: ____________ °Date: ____________ Movements: ____________ Start time: ____________ Finish time: ____________ °Date: ____________ Movements: ____________ Start time: ____________ Finish time: ____________ °Date: ____________ Movements: ____________ Start time: ____________ Finish time: ____________ °Date: ____________ Movements: ____________ Start time: ____________ Finish time: ____________  °Date: ____________ Movements: ____________ Start time: ____________ Finish time: ____________ °Date: ____________ Movements: ____________ Start time: ____________ Finish time: ____________ °Date: ____________ Movements: ____________ Start time: ____________ Finish time: ____________ °Date: ____________ Movements: ____________ Start time: ____________ Finish time: ____________ °Date: ____________ Movements: ____________ Start time: ____________ Finish time: ____________ °Date: ____________ Movements: ____________ Start time: ____________ Finish time: ____________ °Date: ____________ Movements: ____________ Start time: ____________ Finish time: ____________  °Date: ____________ Movements: ____________ Start time: ____________ Finish time: ____________ °Date: ____________ Movements: ____________ Start time: ____________ Finish time: ____________ °Date: ____________ Movements: ____________ Start time: ____________ Finish time: ____________ °Date: ____________ Movements: ____________ Start time: ____________ Finish time: ____________ °Date: ____________ Movements: ____________ Start time: ____________ Finish time: ____________ °Date: ____________ Movements: ____________ Start  time: ____________ Finish time:   ____________ °Date: ____________ Movements: ____________ Start time: ____________ Finish time: ____________  °Date: ____________ Movements: ____________ Start time: ____________ Finish time: ____________ °Date: ____________ Movements: ____________ Start time: ____________ Finish time: ____________ °Date: ____________ Movements: ____________ Start time: ____________ Finish time: ____________ °Date: ____________ Movements: ____________ Start time: ____________ Finish time: ____________ °Date: ____________ Movements: ____________ Start time: ____________ Finish time: ____________ °Date: ____________ Movements: ____________ Start time: ____________ Finish time: ____________ °Date: ____________ Movements: ____________ Start time: ____________ Finish time: ____________  °Date: ____________ Movements: ____________ Start time: ____________ Finish time: ____________ °Date: ____________ Movements: ____________ Start time: ____________ Finish time: ____________ °Date: ____________ Movements: ____________ Start time: ____________ Finish time: ____________ °Date: ____________ Movements: ____________ Start time: ____________ Finish time: ____________ °Date: ____________ Movements: ____________ Start time: ____________ Finish time: ____________ °Date: ____________ Movements: ____________ Start time: ____________ Finish time: ____________ °Date: ____________ Movements: ____________ Start time: ____________ Finish time: ____________  °Date: ____________ Movements: ____________ Start time: ____________ Finish time: ____________ °Date: ____________ Movements: ____________ Start time: ____________ Finish time: ____________ °Date: ____________ Movements: ____________ Start time: ____________ Finish time: ____________ °Date: ____________ Movements: ____________ Start time: ____________ Finish time: ____________ °Date: ____________ Movements: ____________ Start time: ____________ Finish time:  ____________ °Date: ____________ Movements: ____________ Start time: ____________ Finish time: ____________ °Date: ____________ Movements: ____________ Start time: ____________ Finish time: ____________  °Date: ____________ Movements: ____________ Start time: ____________ Finish time: ____________ °Date: ____________ Movements: ____________ Start time: ____________ Finish time: ____________ °Date: ____________ Movements: ____________ Start time: ____________ Finish time: ____________ °Date: ____________ Movements: ____________ Start time: ____________ Finish time: ____________ °Date: ____________ Movements: ____________ Start time: ____________ Finish time: ____________ °Date: ____________ Movements: ____________ Start time: ____________ Finish time: ____________ °  °This information is not intended to replace advice given to you by your health care provider. Make sure you discuss any questions you have with your health care provider. °  °Document Released: 10/03/2006 Document Revised: 09/24/2014 Document Reviewed: 06/30/2012 °Elsevier Interactive Patient Education ©2016 [employer] Inc[employer] ° °

## 2015-11-10 NOTE — MAU Note (Signed)
Patient presents with c/o of contractions that started 0230 this morning. Patient denies LOF with scant blood. Fetus active

## 2015-11-10 NOTE — MAU Note (Signed)
Notified provider that patient is 4.5/60 ctx every 7 min . Provider said patient can be discharged with instructions on signs to come back and to keep her apt. Tomorrow.

## 2015-11-11 ENCOUNTER — Encounter (HOSPITAL_COMMUNITY): Payer: Self-pay

## 2015-11-11 ENCOUNTER — Inpatient Hospital Stay (HOSPITAL_COMMUNITY): Payer: Medicaid Other | Admitting: Anesthesiology

## 2015-11-11 ENCOUNTER — Other Ambulatory Visit: Payer: Medicaid Other

## 2015-11-11 ENCOUNTER — Inpatient Hospital Stay (HOSPITAL_COMMUNITY)
Admission: AD | Admit: 2015-11-11 | Discharge: 2015-11-13 | DRG: 775 | Disposition: A | Discharge status: Home / Self Care | Payer: Medicaid Other | Source: Ambulatory Visit | Attending: Obstetrics & Gynecology | Admitting: Obstetrics & Gynecology

## 2015-11-11 DIAGNOSIS — Z8249 Family history of ischemic heart disease and other diseases of the circulatory system: Secondary | ICD-10-CM

## 2015-11-11 DIAGNOSIS — Z3403 Encounter for supervision of normal first pregnancy, third trimester: Secondary | ICD-10-CM

## 2015-11-11 DIAGNOSIS — Z3A40 40 weeks gestation of pregnancy: Secondary | ICD-10-CM | POA: Diagnosis not present

## 2015-11-11 DIAGNOSIS — IMO0001 Reserved for inherently not codable concepts without codable children: Secondary | ICD-10-CM

## 2015-11-11 LAB — CBC
HCT: 34.7 % — ABNORMAL LOW (ref 36.0–46.0)
HEMOGLOBIN: 11.7 g/dL — AB (ref 12.0–15.0)
MCH: 28.5 pg (ref 26.0–34.0)
MCHC: 33.7 g/dL (ref 30.0–36.0)
MCV: 84.4 fL (ref 78.0–100.0)
PLATELETS: 331 10*3/uL (ref 150–400)
RBC: 4.11 MIL/uL (ref 3.87–5.11)
RDW: 14.2 % (ref 11.5–15.5)
WBC: 20.4 10*3/uL — AB (ref 4.0–10.5)

## 2015-11-11 LAB — TYPE AND SCREEN
ABO/RH(D): A POS
ANTIBODY SCREEN: NEGATIVE

## 2015-11-11 LAB — RPR: RPR: NONREACTIVE

## 2015-11-11 MED ORDER — PHENYLEPHRINE 40 MCG/ML (10ML) SYRINGE FOR IV PUSH (FOR BLOOD PRESSURE SUPPORT)
PREFILLED_SYRINGE | INTRAVENOUS | Status: AC
  Filled 2015-11-11: qty 20

## 2015-11-11 MED ORDER — PHENYLEPHRINE 40 MCG/ML (10ML) SYRINGE FOR IV PUSH (FOR BLOOD PRESSURE SUPPORT)
80.0000 ug | PREFILLED_SYRINGE | INTRAVENOUS | Status: DC | PRN
  Filled 2015-11-11: qty 2

## 2015-11-11 MED ORDER — PHENYLEPHRINE 40 MCG/ML (10ML) SYRINGE FOR IV PUSH (FOR BLOOD PRESSURE SUPPORT): 80.0000 ug | PREFILLED_SYRINGE | INTRAVENOUS | Status: DC | PRN

## 2015-11-11 MED ORDER — ZOLPIDEM TARTRATE 5 MG PO TABS: 5.0000 mg | ORAL_TABLET | Freq: Every evening | ORAL | Status: DC | PRN

## 2015-11-11 MED ORDER — SIMETHICONE 80 MG PO CHEW: 80.0000 mg | CHEWABLE_TABLET | ORAL | Status: DC | PRN

## 2015-11-11 MED ORDER — PHENYLEPHRINE 40 MCG/ML (10ML) SYRINGE FOR IV PUSH (FOR BLOOD PRESSURE SUPPORT)
80.0000 ug | PREFILLED_SYRINGE | INTRAVENOUS | Status: DC | PRN
Start: 2015-11-11 — End: 2015-11-11
  Filled 2015-11-11: qty 2

## 2015-11-11 MED ORDER — PRENATAL MULTIVITAMIN CH
1.0000 | ORAL_TABLET | ORAL | Status: DC
  Administered 2015-11-11 – 2015-11-12 (×2): 1 via ORAL
  Filled 2015-11-11 (×2): qty 1

## 2015-11-11 MED ORDER — FENTANYL 2.5 MCG/ML BUPIVACAINE 1/10 % EPIDURAL INFUSION (WH - ANES)
14.0000 mL/h | INTRAMUSCULAR | Status: DC | PRN
  Administered 2015-11-11: 14 mL/h via EPIDURAL

## 2015-11-11 MED ORDER — ACETAMINOPHEN 325 MG PO TABS: 650.0000 mg | ORAL_TABLET | ORAL | Status: DC | PRN

## 2015-11-11 MED ORDER — MEASLES, MUMPS & RUBELLA VAC ~~LOC~~ INJ
0.5000 mL | INJECTION | Freq: Once | SUBCUTANEOUS | Status: DC
  Filled 2015-11-11: qty 0.5

## 2015-11-11 MED ORDER — EPHEDRINE 5 MG/ML INJ: 10.0000 mg | INTRAVENOUS | Status: DC | PRN

## 2015-11-11 MED ORDER — OXYCODONE-ACETAMINOPHEN 5-325 MG PO TABS: 2.0000 | ORAL_TABLET | ORAL | Status: DC | PRN

## 2015-11-11 MED ORDER — LIDOCAINE HCL (PF) 1 % IJ SOLN
INTRAMUSCULAR | Status: DC | PRN
  Administered 2015-11-11: 3 mL via EPIDURAL
  Administered 2015-11-11: 2 mL via EPIDURAL
  Administered 2015-11-11: 5 mL via EPIDURAL

## 2015-11-11 MED ORDER — LACTATED RINGERS IV SOLN
500.0000 mL | INTRAVENOUS | Status: DC | PRN
Start: 2015-11-11 — End: 2015-11-11

## 2015-11-11 MED ORDER — METHYLERGONOVINE MALEATE 0.2 MG PO TABS: 0.2000 mg | ORAL_TABLET | ORAL | Status: DC | PRN

## 2015-11-11 MED ORDER — DIPHENHYDRAMINE HCL 25 MG PO CAPS: 25.0000 mg | ORAL_CAPSULE | Freq: Four times a day (QID) | ORAL | Status: DC | PRN

## 2015-11-11 MED ORDER — DIPHENHYDRAMINE HCL 50 MG/ML IJ SOLN: 12.5000 mg | INTRAMUSCULAR | Status: DC | PRN

## 2015-11-11 MED ORDER — LACTATED RINGERS IV SOLN: INTRAVENOUS | Status: DC

## 2015-11-11 MED ORDER — LACTATED RINGERS IV SOLN
INTRAVENOUS | Status: DC
  Administered 2015-11-11: 1000 mL via INTRAVENOUS

## 2015-11-11 MED ORDER — EPHEDRINE 5 MG/ML INJ
10.0000 mg | INTRAVENOUS | Status: DC | PRN
Start: 2015-11-11 — End: 2015-11-11
  Filled 2015-11-11: qty 2

## 2015-11-11 MED ORDER — OXYTOCIN 10 UNIT/ML IJ SOLN
2.5000 [IU]/h | INTRAVENOUS | Status: DC
  Filled 2015-11-11: qty 4

## 2015-11-11 MED ORDER — SENNOSIDES-DOCUSATE SODIUM 8.6-50 MG PO TABS
2.0000 | ORAL_TABLET | ORAL | Status: DC
  Administered 2015-11-11 – 2015-11-13 (×2): 2 via ORAL
  Filled 2015-11-11 (×2): qty 2

## 2015-11-11 MED ORDER — BISACODYL 10 MG RE SUPP: 10.0000 mg | Freq: Every day | RECTAL | Status: DC | PRN

## 2015-11-11 MED ORDER — OXYCODONE-ACETAMINOPHEN 5-325 MG PO TABS: 1.0000 | ORAL_TABLET | ORAL | Status: DC | PRN

## 2015-11-11 MED ORDER — ONDANSETRON HCL 4 MG/2ML IJ SOLN: 4.0000 mg | Freq: Four times a day (QID) | INTRAMUSCULAR | Status: DC | PRN

## 2015-11-11 MED ORDER — ONDANSETRON HCL 4 MG/2ML IJ SOLN: 4.0000 mg | INTRAMUSCULAR | Status: DC | PRN

## 2015-11-11 MED ORDER — WITCH HAZEL-GLYCERIN EX PADS: 1.0000 "application " | MEDICATED_PAD | CUTANEOUS | Status: DC | PRN

## 2015-11-11 MED ORDER — LACTATED RINGERS IV SOLN: 500.0000 mL | Freq: Once | INTRAVENOUS | Status: DC

## 2015-11-11 MED ORDER — LACTATED RINGERS IV SOLN
500.0000 mL | INTRAVENOUS | Status: DC | PRN
Start: 2015-11-11 — End: 2015-11-13

## 2015-11-11 MED ORDER — MISOPROSTOL 200 MCG PO TABS
800.0000 ug | ORAL_TABLET | Freq: Once | ORAL | Status: AC
  Administered 2015-11-11: 800 ug via RECTAL

## 2015-11-11 MED ORDER — MISOPROSTOL 200 MCG PO TABS
ORAL_TABLET | ORAL | Status: AC
  Filled 2015-11-11: qty 4

## 2015-11-11 MED ORDER — FLEET ENEMA 7-19 GM/118ML RE ENEM: 1.0000 | ENEMA | RECTAL | Status: DC | PRN

## 2015-11-11 MED ORDER — FERROUS SULFATE 325 (65 FE) MG PO TABS
325.0000 mg | ORAL_TABLET | Freq: Two times a day (BID) | ORAL | Status: DC
  Administered 2015-11-11 – 2015-11-13 (×4): 325 mg via ORAL
  Filled 2015-11-11 (×4): qty 1

## 2015-11-11 MED ORDER — EPHEDRINE 5 MG/ML INJ
10.0000 mg | INTRAVENOUS | Status: DC | PRN
  Filled 2015-11-11: qty 2

## 2015-11-11 MED ORDER — IBUPROFEN 600 MG PO TABS
600.0000 mg | ORAL_TABLET | Freq: Four times a day (QID) | ORAL | Status: DC
  Administered 2015-11-11 – 2015-11-13 (×7): 600 mg via ORAL
  Filled 2015-11-11 (×9): qty 1

## 2015-11-11 MED ORDER — METHYLERGONOVINE MALEATE 0.2 MG/ML IJ SOLN: 0.2000 mg | INTRAMUSCULAR | Status: DC | PRN

## 2015-11-11 MED ORDER — BENZOCAINE-MENTHOL 20-0.5 % EX AERO
1.0000 "application " | INHALATION_SPRAY | CUTANEOUS | Status: DC | PRN
  Administered 2015-11-11: 1 via TOPICAL
  Filled 2015-11-11: qty 56

## 2015-11-11 MED ORDER — DIBUCAINE 1 % RE OINT: 1.0000 "application " | TOPICAL_OINTMENT | RECTAL | Status: DC | PRN

## 2015-11-11 MED ORDER — CITRIC ACID-SODIUM CITRATE 334-500 MG/5ML PO SOLN: 30.0000 mL | ORAL | Status: DC | PRN

## 2015-11-11 MED ORDER — LIDOCAINE HCL (PF) 1 % IJ SOLN
30.0000 mL | INTRAMUSCULAR | Status: DC | PRN
  Filled 2015-11-11: qty 30

## 2015-11-11 MED ORDER — LANOLIN HYDROUS EX OINT: TOPICAL_OINTMENT | CUTANEOUS | Status: DC | PRN

## 2015-11-11 MED ORDER — TETANUS-DIPHTH-ACELL PERTUSSIS 5-2.5-18.5 LF-MCG/0.5 IM SUSP: 0.5000 mL | Freq: Once | INTRAMUSCULAR | Status: DC

## 2015-11-11 MED ORDER — OXYTOCIN 10 UNIT/ML IJ SOLN: 2.5000 [IU]/h | INTRAVENOUS | Status: DC

## 2015-11-11 MED ORDER — ONDANSETRON HCL 4 MG PO TABS: 4.0000 mg | ORAL_TABLET | ORAL | Status: DC | PRN

## 2015-11-11 MED ORDER — OXYTOCIN BOLUS FROM INFUSION
500.0000 mL | INTRAVENOUS | Status: DC
  Administered 2015-11-11: 500 mL via INTRAVENOUS

## 2015-11-11 MED ORDER — FENTANYL 2.5 MCG/ML BUPIVACAINE 1/10 % EPIDURAL INFUSION (WH - ANES): 14.0000 mL/h | INTRAMUSCULAR | Status: DC | PRN

## 2015-11-11 MED ORDER — FENTANYL 2.5 MCG/ML BUPIVACAINE 1/10 % EPIDURAL INFUSION (WH - ANES)
INTRAMUSCULAR | Status: DC
Start: 2015-11-11 — End: 2015-11-11
  Filled 2015-11-11: qty 125

## 2015-11-11 MED ORDER — LACTATED RINGERS IV SOLN
500.0000 mL | Freq: Once | INTRAVENOUS | Status: AC
  Administered 2015-11-11: 500 mL via INTRAVENOUS

## 2015-11-11 MED ORDER — OXYTOCIN BOLUS FROM INFUSION: 500.0000 mL | INTRAVENOUS | Status: DC

## 2015-11-11 MED ORDER — FLEET ENEMA 7-19 GM/118ML RE ENEM
1.0000 | ENEMA | Freq: Every day | RECTAL | Status: DC | PRN
Start: 2015-11-11 — End: 2015-11-13

## 2015-11-11 MED ORDER — FENTANYL CITRATE (PF) 100 MCG/2ML IJ SOLN: 50.0000 ug | INTRAMUSCULAR | Status: DC | PRN

## 2015-11-11 NOTE — Anesthesia Preprocedure Evaluation (Addendum)
Anesthesia Evaluation  Patient identified by MRN, date of birth, ID band Patient awake    Reviewed: Allergy & Precautions, NPO status , Patient's Chart, lab work & pertinent test results  Airway Mallampati: II       Dental   Pulmonary neg pulmonary ROS,    Pulmonary exam normal        Cardiovascular negative cardio ROS Normal cardiovascular exam     Neuro/Psych negative neurological ROS     GI/Hepatic negative GI ROS, Neg liver ROS,   Endo/Other  negative endocrine ROS  Renal/GU negative Renal ROS     Musculoskeletal   Abdominal   Peds  Hematology negative hematology ROS (+)   Anesthesia Other Findings   Reproductive/Obstetrics (+) Pregnancy                            Lab Results  Component Value Date   WBC 20.4* 11/11/2015   HGB 11.7* 11/11/2015   HCT 34.7* 11/11/2015   MCV 84.4 11/11/2015   PLT 331 11/11/2015    Anesthesia Physical Anesthesia Plan  ASA: II  Anesthesia Plan: Epidural   Post-op Pain Management:    Induction:   Airway Management Planned:   Additional Equipment:   Intra-op Plan:   Post-operative Plan:   Informed Consent: I have reviewed the patients History and Physical, chart, labs and discussed the procedure including the risks, benefits and alternatives for the proposed anesthesia with the patient or authorized representative who has indicated his/her understanding and acceptance.     Plan Discussed with:   Anesthesia Plan Comments:         Anesthesia Quick Evaluation

## 2015-11-11 NOTE — MAU Note (Signed)
PT presents complaining of contractions every 2 minutes. Denies leaking or bleeding. Reports good fetal movement.

## 2015-11-11 NOTE — Anesthesia Procedure Notes (Signed)
Epidural Patient location during procedure: OB  Staffing Anesthesiologist: Delara Shepheard Performed by: anesthesiologist   Preanesthetic Checklist Completed: patient identified, site marked, surgical consent, pre-op evaluation, timeout performed, IV checked, risks and benefits discussed and monitors and equipment checked  Epidural Patient position: sitting Prep: site prepped and draped and DuraPrep Patient monitoring: continuous pulse ox and blood pressure Approach: midline Location: L4-L5 Injection technique: LOR saline  Needle:  Needle type: Tuohy  Needle gauge: 17 G Needle length: 9 cm and 9 Needle insertion depth: 6 cm Catheter type: closed end flexible Catheter size: 19 Gauge Catheter at skin depth: 11 cm Test dose: negative  Assessment Events: blood not aspirated, injection not painful, no injection resistance, negative IV test and no paresthesia   

## 2015-11-11 NOTE — H&P (Signed)
Alison Hooper is a 25 y.o. female G1P0 with IUP at [redacted]w[redacted]d presenting for contractions. Pt states she has been having regular contractions, associated with scant staining vaginal bleeding for 4-5 hours. She was seen i MAU earlier in the evening and was 4cms..  Membranes are intact, with active fetal movement.   [facility] at [facility] since 19 wks  Prenatal History/Complications:  Clinic  [facility] Prenatal Labs  Dating  first trimester in [place] Blood type: --/--/A POS, A POS (08/23 1805)   Genetic Screen Quad: negative      Antibody:NEG (08/23 1805)  Anatomic [place]  Normal, female Rubella: 7.40 (08/23 1805)  GTT Third trimester: 169 normal 3 hour RPR: Non Reactive (08/23 1805)   Flu vaccine  07/19/2015 HBsAg: Negative (08/23 1805)   TDaP vaccine   08/18/15                          Rhogam: n/a HIV:   non reactive  Baby Food  breast                                             GBS: neg  Contraception  undecided Pap: negative (06/20/2015)  Circumcision  yes, outpatient   Pediatrician  undecided   Support Person  husband, Jonny Ruiz      Past Medical History: Past Medical History  Diagnosis Date  . Medical history non-contributory     Past Surgical History: Past Surgical History  Procedure Laterality Date  . No past surgeries      Obstetrical History: OB History    Gravida Para Term Preterm AB TAB SAB Ectopic Multiple Living   1                Social History: Social History   Social History  . Marital Status: Married    Spouse Name: N/A  . Number of Children: N/A  . Years of Education: N/A   Social History Main Topics  . Smoking status: Never Smoker   . Smokeless tobacco: Never Used  . Alcohol Use: No  . Drug Use: No  . Sexual Activity: Yes    Birth Control/ Protection: None   Other Topics Concern  . None   Social History Narrative    Family History: Family History  Problem Relation Age of Onset  . Hypertension Mother   . Hypertension Father     Allergies: Allergies   Allergen Reactions  . Phenergan [Promethazine Hcl]     Prescriptions prior to admission  Medication Sig Dispense Refill Last Dose  . ferrous sulfate (IRON SUPPLEMENT) 325 (65 FE) MG tablet Take 1 tablet (325 mg total) by mouth daily with breakfast. (Patient not taking: Reported on 11/09/2015) 30 tablet 3 Not Taking  . folic acid (FOLVITE) 1 MG tablet Take 1 tablet (1 mg total) by mouth daily. 30 tablet 1 Taking  . metroNIDAZOLE (FLAGYL) 500 MG tablet Take 1 tablet (500 mg total) by mouth 2 (two) times daily. (Patient not taking: Reported on 11/02/2015) 14 tablet 0 Not Taking  . triamcinolone (KENALOG) 0.025 % ointment Apply 1 application topically 2 (two) times daily. (Patient not taking: Reported on 10/05/2015) 30 g 2 Not Taking     Prenatal Transfer Tool  Maternal Diabetes: No Genetic Screening: Normal Maternal Ultrasounds/Referrals: Normal Fetal Ultrasounds or other Referrals:  None Maternal Substance Abuse:  No  Significant Maternal Medications:  None Significant Maternal Lab Results: None     Review of Systems   Constitutional: Negative for fever and chills Eyes: Negative for visual disturbances Respiratory: Negative for shortness of breath, dyspnea Cardiovascular: Negative for chest pain or palpitations  Gastrointestinal: Negative for vomiting, diarrhea and constipation.  POSITIVE for abdominal pain (contractions) Genitourinary: Negative for dysuria and urgency Musculoskeletal: Negative for back pain, joint pain, myalgias  Neurological: Negative for dizziness and headaches      Last menstrual period 02/16/2015. General appearance: alert, cooperative and no distress Lungs: clear to auscultation bilaterally Heart: regular rate and rhythm Abdomen: soft, non-tender; bowel sounds normal Extremities: Homans sign is negative, no sign of DVT DTR's 2+ Presentation: cephalic Fetal monitoring  Baseline: 150 bpm, Variability: Good {> 6 bpm), Accelerations: Reactive and  Decelerations: Absent Uterine activity  2-3  Dilation: 7.5 Effacement (%): 80 Station: 0 Exam by:: Sharen Hint RNC   Prenatal labs: ABO, Rh: --/--/A POS, A POS (08/23 1805) Antibody: NEG (08/23 1805) Rubella: !Error! RPR: NON REAC (12/01 1414)  HBsAg: Negative (08/23 1805)  HIV: NONREACTIVE (12/01 1414)  GBS: Negative (02/02 0000)   Assessment: Alison Hooper is a 25 y.o. G1P0 with an IUP at [redacted]w[redacted]d presenting for ACTIVE LABOR  Plan: #Labor: expectant management #Pain:  Per request #FWB Cat 1    CRESENZO-DISHMAN,Lloyd Cullinan 11/11/2015, 2:39 AM

## 2015-11-11 NOTE — Progress Notes (Signed)
Notified of pt arrival in MAU and exam. Will admit to labor and delivery with epidural order

## 2015-11-11 NOTE — Progress Notes (Signed)
UR chart review completed.  

## 2015-11-11 NOTE — Anesthesia Postprocedure Evaluation (Signed)
Anesthesia Post Note  Patient: Alison Hooper  Procedure(s) Performed: * No procedures listed *  Patient location during evaluation: Mother Baby Anesthesia Type: Epidural Level of consciousness: awake and alert Pain management: satisfactory to patient Vital Signs Assessment: post-procedure vital signs reviewed and stable Respiratory status: respiratory function stable Cardiovascular status: stable Postop Assessment: no headache, no backache, epidural receding, patient able to bend at knees, no signs of nausea or vomiting and adequate PO intake Anesthetic complications: no Comments: Comfort level was assessed by AnesthesiaTeam and the patient was pleased with the care, interventions, and services provided by the Department of Anesthesia.    Last Vitals:  Filed Vitals:   11/11/15 0810 11/11/15 0910  BP: 116/69 124/70  Pulse: 110 100  Temp: 37.3 C 37.1 C  Resp: 18 18    Last Pain:  Filed Vitals:   11/11/15 0927  PainSc: 0-No pain                 Kilan Banfill

## 2015-11-12 NOTE — Progress Notes (Signed)
Post Partum Day #1 Subjective: no complaints, up ad lib and tolerating PO; breastfeeding going well; contraception not discussed  Objective: Blood pressure 127/69, pulse 85, temperature 98.2 F (36.8 C), temperature source Oral, resp. rate 18, last menstrual period 02/16/2015, unknown if currently breastfeeding.  Physical Exam:  General: alert, cooperative and no distress Lochia: appropriate Uterine Fundus: firm DVT Evaluation: No evidence of DVT seen on physical exam.   Recent Labs  11/11/15 0255  HGB 11.7*  HCT 34.7*    Assessment/Plan: Plan for discharge tomorrow   LOS: 1 day   Dennise Raabe 11/12/2015, 7:29 AM

## 2015-11-12 NOTE — Lactation Note (Signed)
This note was copied from a baby's chart. Lactation Consultation Note Initial visit made.  Breastfeeding consultation services and support information given and reviewed.  Mom states baby is latching easily and nursing well.  Reviewed feeding cues and instructed to feed with any cue using good waking techniques and breast massage.  Encouraged to call with any concerns/assist prn.  Patient Name: Alison Hooper [id number] Date: 11/12/2015 Reason for consult: Initial assessment;Infant < 6lbs   Maternal Data Formula Feeding for Exclusion: No Has patient been taught Hand Expression?: Yes Does the patient have breastfeeding experience prior to this delivery?: No  Feeding Feeding Type: Breast Fed  LATCH Score/Interventions Latch: Grasps breast easily, tongue down, lips flanged, rhythmical sucking. Intervention(s): Assist with latch  Audible Swallowing: A few with stimulation  Type of Nipple: Everted at rest and after stimulation  Comfort (Breast/Nipple): Soft / non-tender     Hold (Positioning): Assistance needed to correctly position infant at breast and maintain latch.  LATCH Score: 8  Lactation Tools Discussed/Used     Consult Status Consult Status: Follow-up Date: 11/13/15 Follow-up type: In-patient    Huston Foley 11/12/2015, 11:40 AM

## 2015-11-13 MED ORDER — IBUPROFEN 600 MG PO TABS
600.0000 mg | ORAL_TABLET | Freq: Four times a day (QID) | ORAL | Status: DC
Start: 2015-11-13 — End: 2015-12-02

## 2015-11-13 NOTE — Lactation Note (Signed)
This note was copied from a baby's chart. Lactation Consultation Note  Patient Name: Alison Hooper [id number] Date: 11/13/2015 Reason for consult: Follow-up assessment  Visited with Mom and FOB, baby 67 hrs old.  Baby has been having some low temps so discharge cancelled to watch another day.  Baby being held skin to skin with Dad.  Baby had 3 bottles of formula during the night, parents choice.  They are worried that baby isn't getting enough.  Assisted Mom in manually expressing colostrum.  Milk expressed looked like transitional milk.  Reassured parents that mother's milk was exactly suited for baby.  Assisted with latching in cross cradle with teaching on hand placement and support needed.  Baby latches easily, and deeply.  Small discomfort felt initially and then latch was comfortable.  Showed parents how to identify swallowing, and how to keep baby stimulated at breast.  Encouraged Mom to ask for assistance from RN at any time.  Encouraged her to hold off from giving bottles as long as baby is able to latch deeply and feed well.  Follow up in am.   Consult Status Consult Status: Follow-up Date: 11/14/15 Follow-up type: In-patient    [name] 11/13/2015, 12:15 PM

## 2015-11-13 NOTE — Discharge Summary (Signed)
OB Discharge Summary     Patient Name: Alison Hooper DOB: 11/23/1990 MRN: [id number]  Date of admission: 11/11/2015 Delivering MD: Jacklyn Shell   Date of discharge: 11/13/2015  Admitting diagnosis: 40 WEEKS CTX Intrauterine pregnancy: [redacted]w[redacted]d     Secondary diagnosis:  Active Problems:   Active labor   Normal labor and delivery  Additional problems: late to care, unexplained proteinuria     Discharge diagnosis: Term Pregnancy Delivered                                                                                                Post partum procedures:none  Augmentation: none  Complications: None  Hospital course:  Onset of Labor With Vaginal Delivery     25 y.o. yo G1P1001 at [redacted]w[redacted]d was admitted in Active Labor on 11/11/2015. Patient had an uncomplicated labor course as follows:  Membrane Rupture Time/Date: 4:00 AM ,11/11/2015   Intrapartum Procedures: Episiotomy: None [1]                                         Lacerations:  1st degree [2];Labial [10]  Patient had a delivery of a Viable infant. 11/11/2015  Information for the patient's newborn:  Simone, Rodenbeck [[id number]]  Delivery Method: Vaginal, Spontaneous Delivery (Filed from Delivery Summary)    Pateint had an uncomplicated postpartum course.  She is ambulating, tolerating a regular diet, passing flatus, and urinating well. Patient is discharged home in stable condition on 11/13/2015.    Physical exam  Filed Vitals:   11/11/15 2030 11/12/15 0614 11/12/15 1849 11/13/15 0520  BP: 117/79 127/69 117/66 128/89  Pulse: 101 85 73 93  Temp: 98.8 F (37.1 C) 98.2 F (36.8 C) 98.3 F (36.8 C) 97.8 F (36.6 C)  TempSrc: Oral Oral Oral Oral  Resp: General: alert, cooperative and no distress Lochia: appropriate Uterine Fundus: firm Incision: Healing well with no significant drainage DVT Evaluation: No evidence of DVT seen on physical exam. Labs: Lab Results  Component Value Date   WBC  20.4* 11/11/2015   HGB 11.7* 11/11/2015   HCT 34.7* 11/11/2015   MCV 84.4 11/11/2015   PLT 331 11/11/2015   CMP Latest Ref Rng 10/26/2015  Glucose 65 - 99 mg/dL 70  BUN 7 - 25 mg/dL 7  Creatinine 5.36 - 6.44 mg/dL 0.34  Sodium 742 - 595 mmol/L 134(L)  Potassium 3.5 - 5.3 mmol/L 4.5  Chloride 98 - 110 mmol/L 102  CO2 20 - 31 mmol/L 21  Calcium 8.6 - 10.2 mg/dL 6.3(O)  Total Protein 6.1 - 8.1 g/dL 6.1  Total Bilirubin 0.2 - 1.2 mg/dL 0.3  Alkaline Phos 33 - 115 U/L 145(H)  AST 10 - 30 U/L 15  ALT 6 - 29 U/L 7    Discharge instruction: per After Visit Summary and "Baby and Me Booklet".  After visit meds:    Medication List    TAKE these medications        folic  acid 1 MG tablet  Commonly known as:  FOLVITE  Take 1 tablet (1 mg total) by mouth daily.     ibuprofen 600 MG tablet  Commonly known as:  ADVIL,MOTRIN  Take 1 tablet (600 mg total) by mouth every 6 (six) hours.        Diet: routine diet  Activity: Advance as tolerated. Pelvic rest for 6 weeks.   Outpatient follow up:4 weeks Follow up Appt:Future Appointments Date Time Provider Department Center  12/19/2015 12:45 PM Adam Phenix, MD [facility]   Follow up Visit:No Follow-up on file.  Postpartum contraception: Undecided  Newborn Data: Live born female  Birth Weight: 6 lb 1.9 oz (2775 g) APGAR: 8, 9  Baby Feeding: Breast Disposition:home with mother   11/13/2015 Wynelle Bourgeois, CNM

## 2015-11-13 NOTE — Discharge Instructions (Signed)
Contraception Choices Contraception (birth control) is the use of any methods or devices to prevent pregnancy. Below are some methods to help avoid pregnancy. HORMONAL METHODS   Contraceptive implant. This is a thin, plastic tube containing progesterone hormone. It does not contain estrogen hormone. Your health care provider inserts the tube in the inner part of the upper arm. The tube can remain in place for up to 3 years. After 3 years, the implant must be removed. The implant prevents the ovaries from releasing an egg (ovulation), thickens the cervical mucus to prevent sperm from entering the uterus, and thins the lining of the inside of the uterus.  Progesterone-only injections. These injections are given every 3 months by your health care provider to prevent pregnancy. This synthetic progesterone hormone stops the ovaries from releasing eggs. It also thickens cervical mucus and changes the uterine lining. This makes it harder for sperm to survive in the uterus.  Birth control pills. These pills contain estrogen and progesterone hormone. They work by preventing the ovaries from releasing eggs (ovulation). They also cause the cervical mucus to thicken, preventing the sperm from entering the uterus. Birth control pills are prescribed by a health care provider.Birth control pills can also be used to treat heavy periods.  Minipill. This type of birth control pill contains only the progesterone hormone. They are taken every day of each month and must be prescribed by your health care provider.  Birth control patch. The patch contains hormones similar to those in birth control pills. It must be changed once a week and is prescribed by a health care provider.  Vaginal ring. The ring contains hormones similar to those in birth control pills. It is left in the vagina for 3 weeks, removed for 1 week, and then a new one is put back in place. The patient must be comfortable inserting and removing the ring  from the vagina.A health care provider's prescription is necessary.  Emergency contraception. Emergency contraceptives prevent pregnancy after unprotected sexual intercourse. This pill can be taken right after sex or up to 5 days after unprotected sex. It is most effective the sooner you take the pills after having sexual intercourse. Most emergency contraceptive pills are available without a prescription. Check with your pharmacist. Do not use emergency contraception as your only form of birth control. BARRIER METHODS   Female condom. This is a thin sheath (latex or rubber) that is worn over the penis during sexual intercourse. It can be used with spermicide to increase effectiveness.  Female condom. This is a soft, loose-fitting sheath that is put into the vagina before sexual intercourse.  Diaphragm. This is a soft, latex, dome-shaped barrier that must be fitted by a health care provider. It is inserted into the vagina, along with a spermicidal jelly. It is inserted before intercourse. The diaphragm should be left in the vagina for 6 to 8 hours after intercourse.  Cervical cap. This is a round, soft, latex or plastic cup that fits over the cervix and must be fitted by a health care provider. The cap can be left in place for up to 48 hours after intercourse.  Sponge. This is a soft, circular piece of polyurethane foam. The sponge has spermicide in it. It is inserted into the vagina after wetting it and before sexual intercourse.  Spermicides. These are chemicals that kill or block sperm from entering the cervix and uterus. They come in the form of creams, jellies, suppositories, foam, or tablets. They do not require a  prescription. They are inserted into the vagina with an applicator before having sexual intercourse. The process must be repeated every time you have sexual intercourse. INTRAUTERINE CONTRACEPTION  Intrauterine device (IUD). This is a T-shaped device that is put in a woman's uterus  during a menstrual period to prevent pregnancy. There are 2 types:  Copper IUD. This type of IUD is wrapped in copper wire and is placed inside the uterus. Copper makes the uterus and fallopian tubes produce a fluid that kills sperm. It can stay in place for 10 years.  Hormone IUD. This type of IUD contains the hormone progestin (synthetic progesterone). The hormone thickens the cervical mucus and prevents sperm from entering the uterus, and it also thins the uterine lining to prevent implantation of a fertilized egg. The hormone can weaken or kill the sperm that get into the uterus. It can stay in place for 3-5 years, depending on which type of IUD is used. PERMANENT METHODS OF CONTRACEPTION  Female tubal ligation. This is when the woman's fallopian tubes are surgically sealed, tied, or blocked to prevent the egg from traveling to the uterus.  Hysteroscopic sterilization. This involves placing a small coil or insert into each fallopian tube. Your doctor uses a technique called hysteroscopy to do the procedure. The device causes scar tissue to form. This results in permanent blockage of the fallopian tubes, so the sperm cannot fertilize the egg. It takes about 3 months after the procedure for the tubes to become blocked. You must use another form of birth control for these 3 months.  Female sterilization. This is when the female has the tubes that carry sperm tied off (vasectomy).This blocks sperm from entering the vagina during sexual intercourse. After the procedure, the man can still ejaculate fluid (semen). NATURAL PLANNING METHODS  Natural family planning. This is not having sexual intercourse or using a barrier method (condom, diaphragm, cervical cap) on days the woman could become pregnant.  Calendar method. This is keeping track of the length of each menstrual cycle and identifying when you are fertile.  Ovulation method. This is avoiding sexual intercourse during ovulation.  Symptothermal  method. This is avoiding sexual intercourse during ovulation, using a thermometer and ovulation symptoms.  Post-ovulation method. This is timing sexual intercourse after you have ovulated. Regardless of which type or method of contraception you choose, it is important that you use condoms to protect against the transmission of sexually transmitted infections (STIs). Talk with your health care provider about which form of contraception is most appropriate for you.   This information is not intended to replace advice given to you by your health care provider. Make sure you discuss any questions you have with your health care provider.   Document Released: 09/03/2005 Document Revised: 09/08/2013 Document Reviewed: 02/26/2013 Elsevier Interactive Patient Education [employer]. Breastfeeding Deciding to breastfeed is one of the best choices you can make for you and your baby. A change in hormones during pregnancy causes your breast tissue to grow and increases the number and size of your milk ducts. These hormones also allow proteins, sugars, and fats from your blood supply to make breast milk in your milk-producing glands. Hormones prevent breast milk from being released before your baby is born as well as prompt milk flow after birth. Once breastfeeding has begun, thoughts of your baby, as well as his or her sucking or crying, can stimulate the release of milk from your milk-producing glands.  BENEFITS OF BREASTFEEDING For Your Baby  Your  first milk (colostrum) helps your baby's digestive system function better.  There are antibodies in your milk that help your baby fight off infections.  Your baby has a lower incidence of asthma, allergies, and sudden infant death syndrome.  The nutrients in breast milk are better for your baby than infant formulas and are designed uniquely for your baby's needs.  Breast milk improves your baby's brain development.  Your baby is less likely to develop  other conditions, such as childhood obesity, asthma, or type 2 diabetes mellitus. For You  Breastfeeding helps to create a very special bond between you and your baby.  Breastfeeding is convenient. Breast milk is always available at the correct temperature and costs nothing.  Breastfeeding helps to burn calories and helps you lose the weight gained during pregnancy.  Breastfeeding makes your uterus contract to its prepregnancy size faster and slows bleeding (lochia) after you give birth.   Breastfeeding helps to lower your risk of developing type 2 diabetes mellitus, osteoporosis, and breast or ovarian cancer later in life. SIGNS THAT YOUR BABY IS HUNGRY Early Signs of Hunger  Increased alertness or activity.  Stretching.  Movement of the head from side to side.  Movement of the head and opening of the mouth when the corner of the mouth or cheek is stroked (rooting).  Increased sucking sounds, smacking lips, cooing, sighing, or squeaking.  Hand-to-mouth movements.  Increased sucking of fingers or hands. Late Signs of Hunger  Fussing.  Intermittent crying. Extreme Signs of Hunger Signs of extreme hunger will require calming and consoling before your baby will be able to breastfeed successfully. Do not wait for the following signs of extreme hunger to occur before you initiate breastfeeding:  Restlessness.  A loud, strong cry.  Screaming. BREASTFEEDING BASICS Breastfeeding Initiation  Find a comfortable place to sit or lie down, with your neck and back well supported.  Place a pillow or rolled up blanket under your baby to bring him or her to the level of your breast (if you are seated). Nursing pillows are specially designed to help support your arms and your baby while you breastfeed.  Make sure that your baby's abdomen is facing your abdomen.  Gently massage your breast. With your fingertips, massage from your chest wall toward your nipple in a circular motion.  This encourages milk flow. You may need to continue this action during the feeding if your milk flows slowly.  Support your breast with 4 fingers underneath and your thumb above your nipple. Make sure your fingers are well away from your nipple and your baby's mouth.  Stroke your baby's lips gently with your finger or nipple.  When your baby's mouth is open wide enough, quickly bring your baby to your breast, placing your entire nipple and as much of the colored area around your nipple (areola) as possible into your baby's mouth.  More areola should be visible above your baby's upper lip than below the lower lip.  Your baby's tongue should be between his or her lower gum and your breast.  Ensure that your baby's mouth is correctly positioned around your nipple (latched). Your baby's lips should create a seal on your breast and be turned out (everted).  It is common for your baby to suck about 2-3 minutes in order to start the flow of breast milk. Latching Teaching your baby how to latch on to your breast properly is very important. An improper latch can cause nipple pain and decreased milk supply for you  and poor weight gain in your baby. Also, if your baby is not latched onto your nipple properly, he or she may swallow some air during feeding. This can make your baby fussy. Burping your baby when you switch breasts during the feeding can help to get rid of the air. However, teaching your baby to latch on properly is still the best way to prevent fussiness from swallowing air while breastfeeding. Signs that your baby has successfully latched on to your nipple:  Silent tugging or silent sucking, without causing you pain.  Swallowing heard between every 3-4 sucks.  Muscle movement above and in front of his or her ears while sucking. Signs that your baby has not successfully latched on to nipple:  Sucking sounds or smacking sounds from your baby while breastfeeding.  Nipple pain. If you  think your baby has not latched on correctly, slip your finger into the corner of your baby's mouth to break the suction and place it between your baby's gums. Attempt breastfeeding initiation again. Signs of Successful Breastfeeding Signs from your baby:  A gradual decrease in the number of sucks or complete cessation of sucking.  Falling asleep.  Relaxation of his or her body.  Retention of a small amount of milk in his or her mouth.  Letting go of your breast by himself or herself. Signs from you:  Breasts that have increased in firmness, weight, and size 1-3 hours after feeding.  Breasts that are softer immediately after breastfeeding.  Increased milk volume, as well as a change in milk consistency and color by the fifth day of breastfeeding.  Nipples that are not sore, cracked, or bleeding. Signs That Your [employer] is Getting Enough Milk  Wetting at least 3 diapers in a 24-hour period. The urine should be clear and pale yellow by age 315 days.  At least 3 stools in a 24-hour period by age 315 days. The stool should be soft and yellow.  At least 3 stools in a 24-hour period by age 93 days. The stool should be seedy and yellow.  No loss of weight greater than 10% of birth weight during the first 30 days of age.  Average weight gain of 4-7 ounces (113-198 g) per week after age 31 days.  Consistent daily weight gain by age 25 days, without weight loss after the age of 2 weeks. After a feeding, your baby may spit up a small amount. This is common. BREASTFEEDING FREQUENCY AND DURATION Frequent feeding will help you make more milk and can prevent sore nipples and breast engorgement. Breastfeed when you feel the need to reduce the fullness of your breasts or when your baby shows signs of hunger. This is called "breastfeeding on demand." Avoid introducing a pacifier to your baby while you are working to establish breastfeeding (the first 4-6 weeks after your baby is born). After this time you  may choose to use a pacifier. Research has shown that pacifier use during the first year of a baby's life decreases the risk of sudden infant death syndrome (SIDS). Allow your baby to feed on each breast as long as he or she wants. Breastfeed until your baby is finished feeding. When your baby unlatches or falls asleep while feeding from the first breast, offer the second breast. Because newborns are often sleepy in the first few weeks of life, you may need to awaken your baby to get him or her to feed. Breastfeeding times will vary from baby to baby. However, the following rules  can serve as a guide to help you ensure that your baby is properly fed:  Newborns (babies 66 weeks of age or younger) may breastfeed every 1-3 hours.  Newborns should not go longer than 3 hours during the day or 5 hours during the night without breastfeeding.  You should breastfeed your baby a minimum of 8 times in a 24-hour period until you begin to introduce solid foods to your baby at around 76 months of age. BREAST MILK PUMPING Pumping and storing breast milk allows you to ensure that your baby is exclusively fed your breast milk, even at times when you are unable to breastfeed. This is especially important if you are going back to work while you are still breastfeeding or when you are not able to be present during feedings. Your lactation consultant can give you guidelines on how long it is safe to store breast milk. A breast pump is a machine that allows you to pump milk from your breast into a sterile bottle. The pumped breast milk can then be stored in a refrigerator or freezer. Some breast pumps are operated by hand, while others use electricity. Ask your lactation consultant which type will work best for you. Breast pumps can be purchased, but some hospitals and breastfeeding support groups lease breast pumps on a monthly basis. A lactation consultant can teach you how to hand express breast milk, if you prefer not to use  a pump. CARING FOR YOUR BREASTS WHILE YOU BREASTFEED Nipples can become dry, cracked, and sore while breastfeeding. The following recommendations can help keep your breasts moisturized and healthy:  Avoid using soap on your nipples.  Wear a supportive bra. Although not required, special nursing bras and tank tops are designed to allow access to your breasts for breastfeeding without taking off your entire bra or top. Avoid wearing underwire-style bras or extremely tight bras.  Air dry your nipples for 3-24mnutes after each feeding.  Use only cotton bra pads to absorb leaked breast milk. Leaking of breast milk between feedings is normal.  Use lanolin on your nipples after breastfeeding. Lanolin helps to maintain your skin's normal moisture barrier. If you use pure lanolin, you do not need to wash it off before feeding your baby again. Pure lanolin is not toxic to your baby. You may also hand express a few drops of breast milk and gently massage that milk into your nipples and allow the milk to air dry. In the first few weeks after giving birth, some women experience extremely full breasts (engorgement). Engorgement can make your breasts feel heavy, warm, and tender to the touch. Engorgement peaks within 3-5 days after you give birth. The following recommendations can help ease engorgement:  Completely empty your breasts while breastfeeding or pumping. You may want to start by applying warm, moist heat (in the shower or with warm water-soaked hand towels) just before feeding or pumping. This increases circulation and helps the milk flow. If your baby does not completely empty your breasts while breastfeeding, pump any extra milk after he or she is finished.  Wear a snug bra (nursing or regular) or tank top for 1-2 days to signal your body to slightly decrease milk production.  Apply ice packs to your breasts, unless this is too uncomfortable for you.  Make sure that your baby is latched on and  positioned properly while breastfeeding. If engorgement persists after 48 hours of following these recommendations, contact your health care provider or a lScience writer [place]  RECOMMENDATIONS WHILE BREASTFEEDING  Eat healthy foods. Alternate between meals and snacks, eating 3 of each per day. Because what you eat affects your breast milk, some of the foods may make your baby more irritable than usual. Avoid eating these foods if you are sure that they are negatively affecting your baby.  Drink milk, fruit juice, and water to satisfy your thirst (about 10 glasses a day).  Rest often, relax, and continue to take your prenatal vitamins to prevent fatigue, stress, and anemia.  Continue breast self-awareness checks.  Avoid chewing and smoking tobacco. Chemicals from cigarettes that pass into breast milk and exposure to secondhand smoke may harm your baby.  Avoid alcohol and drug use, including marijuana. Some medicines that may be harmful to your baby can pass through breast milk. It is important to ask your health care provider before taking any medicine, including all over-the-counter and prescription medicine as well as vitamin and herbal supplements. It is possible to become pregnant while breastfeeding. If birth control is desired, ask your health care provider about options that will be safe for your baby. SEEK MEDICAL CARE IF:  You feel like you want to stop breastfeeding or have become frustrated with breastfeeding.  You have painful breasts or nipples.  Your nipples are cracked or bleeding.  Your breasts are red, tender, or warm.  You have a swollen area on either breast.  You have a fever or chills.  You have nausea or vomiting.  You have drainage other than breast milk from your nipples.  Your breasts do not become full before feedings by the fifth day after you give birth.  You feel sad and depressed.  Your baby is too sleepy to eat well.  Your  baby is having trouble sleeping.   Your baby is wetting less than 3 diapers in a 24-hour period.  Your baby has less than 3 stools in a 24-hour period.  Your baby's skin or the white part of his or her eyes becomes yellow.   Your baby is not gaining weight by 82 days of age. SEEK IMMEDIATE MEDICAL CARE IF:  Your baby is overly tired (lethargic) and does not want to wake up and feed.  Your baby develops an unexplained fever.   This information is not intended to replace advice given to you by your health care provider. Make sure you discuss any questions you have with your health care provider.   Document Released: 09/03/2005 Document Revised: 05/25/2015 Document Reviewed: 02/25/2013 Elsevier Interactive Patient Education 2016 [employer] Postpartum Care After Vaginal Delivery After you deliver your newborn (postpartum period), the usual stay in the hospital is 24-72 hours. If there were problems with your labor or delivery, or if you have other medical problems, you might be in the hospital longer.  While you are in the hospital, you will receive help and instructions on how to care for yourself and your newborn during the postpartum period.  While you are in the hospital:  Be sure to tell your nurses if you have pain or discomfort, as well as where you feel the pain and what makes the pain worse.  If you had an incision made near your vagina (episiotomy) or if you had some tearing during delivery, the nurses may put ice packs on your episiotomy or tear. The ice packs may help to reduce the pain and swelling.  If you are breastfeeding, you may feel uncomfortable contractions of your uterus for a couple of weeks. This is normal.  The contractions help your uterus get back to normal size.  It is normal to have some bleeding after delivery.  For the first 1-3 days after delivery, the flow is red and the amount may be similar to a period.  It is common for the flow to start and  stop.  In the first few days, you may pass some small clots. Let your nurses know if you begin to pass large clots or your flow increases.  Do not  flush blood clots down the toilet before having the nurse look at them.  During the next 3-10 days after delivery, your flow should become more watery and pink or brown-tinged in color.  Ten to fourteen days after delivery, your flow should be a small amount of yellowish-white discharge.  The amount of your flow will decrease over the first few weeks after delivery. Your flow may stop in 6-8 weeks. Most women have had their flow stop by 12 weeks after delivery.  You should change your sanitary pads frequently.  Wash your hands thoroughly with soap and water for at least 20 seconds after changing pads, using the toilet, or before holding or feeding your newborn.  You should feel like you need to empty your bladder within the first 6-8 hours after delivery.  In case you become weak, lightheaded, or faint, call your nurse before you get out of bed for the first time and before you take a shower for the first time.  Within the first few days after delivery, your breasts may begin to feel tender and full. This is called engorgement. Breast tenderness usually goes away within 48-72 hours after engorgement occurs. You may also notice milk leaking from your breasts. If you are not breastfeeding, do not stimulate your breasts. Breast stimulation can make your breasts produce more milk.  Spending as much time as possible with your newborn is very important. During this time, you and your newborn can feel close and get to know each other. Having your newborn stay in your room (rooming in) will help to strengthen the bond with your newborn. It will give you time to get to know your newborn and become comfortable caring for your newborn.  Your hormones change after delivery. Sometimes the hormone changes can temporarily cause you to feel sad or tearful. These  feelings should not last more than a few days. If these feelings last longer than that, you should talk to your caregiver.  If desired, talk to your caregiver about methods of family planning or contraception.  Talk to your caregiver about immunizations. Your caregiver may want you to have the following immunizations before leaving the hospital:  Tetanus, diphtheria, and pertussis (Tdap) or tetanus and diphtheria (Td) immunization. It is very important that you and your family (including grandparents) or others caring for your newborn are up-to-date with the Tdap or Td immunizations. The Tdap or Td immunization can help protect your newborn from getting ill.  Rubella immunization.  Varicella (chickenpox) immunization.  Influenza immunization. You should receive this annual immunization if you did not receive the immunization during your pregnancy.   This information is not intended to replace advice given to you by your health care provider. Make sure you discuss any questions you have with your health care provider.   Document Released: 07/01/2007 Document Revised: 05/28/2012 Document Reviewed: 04/30/2012 Elsevier Interactive Patient Education [employer]

## 2015-11-16 ENCOUNTER — Encounter: Payer: Medicaid Other | Admitting: Obstetrics and Gynecology

## 2015-11-30 ENCOUNTER — Telehealth: Payer: Self-pay

## 2015-11-30 NOTE — Telephone Encounter (Signed)
PT FMLA PAPERS HAVE BEEN COMPLETED PT HAS BEEN NOTIFIED.

## 2015-12-02 ENCOUNTER — Other Ambulatory Visit: Payer: Self-pay | Admitting: Advanced Practice Midwife

## 2015-12-19 ENCOUNTER — Ambulatory Visit (INDEPENDENT_AMBULATORY_CARE_PROVIDER_SITE_OTHER): Payer: Medicaid Other | Admitting: Obstetrics & Gynecology

## 2015-12-19 ENCOUNTER — Encounter: Payer: Self-pay | Admitting: Obstetrics & Gynecology

## 2015-12-19 NOTE — Progress Notes (Signed)
Patient ID: Alison Hooper, female   DOB: 09/04/1991, 25 y.o.   MRN: [id number]  Subjective:     Alison Hooper is a 25 y.o. female who presents for a postpartum visit. Pt deliver on 11/11/2015. I have fully reviewed the prenatal and intrapartum course. The delivery was at 40 gestational weeks. Outcome: delivery Anesthesia: Epidural.  Postpartum course has been completed. Baby's course has been completed. Baby is feeding by breast and bottle. Some bleeding at this time. Bowel function is normal. Bladder function is normal. Patient is not sexually active. Contraception method not a this time. Postpartum depression screening: negative  The following portions of the patient's history were reviewed and updated as appropriate: allergies, current medications, past family history, past medical history, past social history, past surgical history and problem list.  Review of Systems Pertinent items are noted in HPI.   Objective:    BP 96/71 mmHg  Pulse 84  Temp(Src) 98.5 F (36.9 C)  Wt 103 lb 11.2 oz (47.038 kg)  General:  alert, cooperative and no distress           Abdomen: soft, non-tender; bowel sounds normal; no masses,  no organomegaly   Vulva:  not evaluated  Vagina: not evaluated                    Assessment:     normal postpartum exam. Pap smear not done at today's visit.   Plan:    1. Contraception: none 2. On menses today 3. Follow up as needed.    Adam PhenixJames G Arnold, MD 12/19/2015

## 2016-01-02 ENCOUNTER — Other Ambulatory Visit: Payer: Self-pay | Admitting: General Practice

## 2016-01-02 ENCOUNTER — Telehealth: Payer: Self-pay | Admitting: General Practice

## 2016-01-02 ENCOUNTER — Encounter: Payer: Self-pay | Admitting: General Practice

## 2016-01-02 DIAGNOSIS — R809 Proteinuria, unspecified: Secondary | ICD-10-CM

## 2016-01-02 NOTE — Telephone Encounter (Signed)
Per Dr Ashok PallWouk, this postpartum patient had unexplained proteinuria throughout pregnancy and needs referral to a nephrologist. [facility] and once completed they will contact us & the patient with the appt.

## 2016-03-14 ENCOUNTER — Ambulatory Visit: Payer: Medicaid Other

## 2016-03-19 ENCOUNTER — Encounter (HOSPITAL_COMMUNITY): Payer: Self-pay

## 2016-03-19 ENCOUNTER — Inpatient Hospital Stay (HOSPITAL_COMMUNITY)
Admission: AD | Admit: 2016-03-19 | Discharge: 2016-03-19 | Disposition: A | Discharge status: Home / Self Care | Payer: Medicaid Other | Source: Ambulatory Visit | Attending: Obstetrics and Gynecology | Admitting: Obstetrics and Gynecology

## 2016-03-19 ENCOUNTER — Encounter: Payer: Self-pay | Admitting: *Deleted

## 2016-03-19 ENCOUNTER — Ambulatory Visit (INDEPENDENT_AMBULATORY_CARE_PROVIDER_SITE_OTHER): Payer: Self-pay | Admitting: *Deleted

## 2016-03-19 DIAGNOSIS — O99281 Endocrine, nutritional and metabolic diseases complicating pregnancy, first trimester: Secondary | ICD-10-CM | POA: Insufficient documentation

## 2016-03-19 DIAGNOSIS — O219 Vomiting of pregnancy, unspecified: Secondary | ICD-10-CM

## 2016-03-19 DIAGNOSIS — Z3A08 8 weeks gestation of pregnancy: Secondary | ICD-10-CM | POA: Insufficient documentation

## 2016-03-19 DIAGNOSIS — R12 Heartburn: Secondary | ICD-10-CM | POA: Diagnosis not present

## 2016-03-19 DIAGNOSIS — O26891 Other specified pregnancy related conditions, first trimester: Secondary | ICD-10-CM | POA: Insufficient documentation

## 2016-03-19 DIAGNOSIS — Z3201 Encounter for pregnancy test, result positive: Secondary | ICD-10-CM

## 2016-03-19 DIAGNOSIS — E876 Hypokalemia: Secondary | ICD-10-CM | POA: Diagnosis not present

## 2016-03-19 DIAGNOSIS — O210 Mild hyperemesis gravidarum: Secondary | ICD-10-CM | POA: Insufficient documentation

## 2016-03-19 DIAGNOSIS — Z3490 Encounter for supervision of normal pregnancy, unspecified, unspecified trimester: Secondary | ICD-10-CM

## 2016-03-19 LAB — COMPREHENSIVE METABOLIC PANEL
ALK PHOS: 32 U/L — AB (ref 38–126)
ALT: 21 U/L (ref 14–54)
AST: 28 U/L (ref 15–41)
Albumin: 2.9 g/dL — ABNORMAL LOW (ref 3.5–5.0)
Anion gap: 9 (ref 5–15)
BUN: 10 mg/dL (ref 6–20)
CALCIUM: 8.7 mg/dL — AB (ref 8.9–10.3)
CO2: 23 mmol/L (ref 22–32)
CREATININE: 0.65 mg/dL (ref 0.44–1.00)
Chloride: 99 mmol/L — ABNORMAL LOW (ref 101–111)
Glucose, Bld: 368 mg/dL — ABNORMAL HIGH (ref 65–99)
Potassium: 3.1 mmol/L — ABNORMAL LOW (ref 3.5–5.1)
Sodium: 131 mmol/L — ABNORMAL LOW (ref 135–145)
TOTAL PROTEIN: 6.4 g/dL — AB (ref 6.5–8.1)
Total Bilirubin: 0.6 mg/dL (ref 0.3–1.2)

## 2016-03-19 LAB — URINALYSIS, ROUTINE W REFLEX MICROSCOPIC
GLUCOSE, UA: NEGATIVE mg/dL
KETONES UR: 15 mg/dL — AB
NITRITE: NEGATIVE
PH: 6 (ref 5.0–8.0)
PROTEIN: 100 mg/dL — AB
Specific Gravity, Urine: >1.03 — ABNORMAL HIGH (ref 1.005–1.030)

## 2016-03-19 LAB — URINE MICROSCOPIC-ADD ON

## 2016-03-19 LAB — CBC
HCT: 38 % (ref 36.0–46.0)
HEMOGLOBIN: 13.1 g/dL (ref 12.0–15.0)
MCH: 28.1 pg (ref 26.0–34.0)
MCHC: 34.5 g/dL (ref 30.0–36.0)
MCV: 81.5 fL (ref 78.0–100.0)
Platelets: 365 10*3/uL (ref 150–400)
RBC: 4.66 MIL/uL (ref 3.87–5.11)
RDW: 12.8 % (ref 11.5–15.5)
WBC: 8.7 10*3/uL (ref 4.0–10.5)

## 2016-03-19 LAB — POCT PREGNANCY, URINE: PREG TEST UR: POSITIVE — AB

## 2016-03-19 LAB — GLUCOSE, CAPILLARY: GLUCOSE-CAPILLARY: 119 mg/dL — AB (ref 65–99)

## 2016-03-19 MED ORDER — METOCLOPRAMIDE HCL 10 MG PO TABS
10.0000 mg | ORAL_TABLET | Freq: Four times a day (QID) | ORAL | Status: DC
Start: 2016-03-19 — End: 2016-10-26

## 2016-03-19 MED ORDER — METOCLOPRAMIDE HCL 5 MG/ML IJ SOLN
10.0000 mg | Freq: Once | INTRAMUSCULAR | Status: AC
  Administered 2016-03-19: 10 mg via INTRAVENOUS
  Filled 2016-03-19: qty 2

## 2016-03-19 MED ORDER — METOCLOPRAMIDE HCL 10 MG PO TABS: 10.0000 mg | ORAL_TABLET | Freq: Four times a day (QID) | ORAL | Status: DC

## 2016-03-19 MED ORDER — RANITIDINE HCL 150 MG PO TABS: 150.0000 mg | ORAL_TABLET | Freq: Two times a day (BID) | ORAL | Status: DC

## 2016-03-19 MED ORDER — POTASSIUM CHLORIDE CRYS ER 20 MEQ PO TBCR: 20.0000 meq | EXTENDED_RELEASE_TABLET | Freq: Two times a day (BID) | ORAL | Status: DC

## 2016-03-19 MED ORDER — FAMOTIDINE IN NACL 20-0.9 MG/50ML-% IV SOLN
20.0000 mg | Freq: Once | INTRAVENOUS | Status: AC
  Administered 2016-03-19: 20 mg via INTRAVENOUS
  Filled 2016-03-19: qty 50

## 2016-03-19 MED ORDER — DEXTROSE 5 % IN LACTATED RINGERS IV BOLUS
1000.0000 mL | Freq: Once | INTRAVENOUS | Status: AC
  Administered 2016-03-19: 1000 mL via INTRAVENOUS

## 2016-03-19 NOTE — Discharge Instructions (Signed)
Morning Sickness Morning sickness is when you feel sick to your stomach (nauseous) during pregnancy. This nauseous feeling may or may not come with vomiting. It often occurs in the morning but can be a problem any time of day. Morning sickness is most common during the first trimester, but it may continue throughout pregnancy. While morning sickness is unpleasant, it is usually harmless unless you develop severe and continual vomiting (hyperemesis gravidarum). This condition requires more intense treatment.  CAUSES  The cause of morning sickness is not completely known but seems to be related to normal hormonal changes that occur in pregnancy. RISK FACTORS You are at greater risk if you:  Experienced nausea or vomiting before your pregnancy.  Had morning sickness during a previous pregnancy.  Are pregnant with more than one baby, such as twins. TREATMENT  Do not use any medicines (prescription, over-the-counter, or herbal) for morning sickness without first talking to your health care provider. Your health care provider may prescribe or recommend:  Vitamin B6 supplements.  Anti-nausea medicines.  The herbal medicine ginger. HOME CARE INSTRUCTIONS   Only take over-the-counter or prescription medicines as directed by your health care provider.  Taking multivitamins before getting pregnant can prevent or decrease the severity of morning sickness in most women.  Eat a piece of dry toast or unsalted crackers before getting out of bed in the morning.  Eat five or six small meals a day.  Eat dry and bland foods (rice, baked potato). Foods high in carbohydrates are often helpful.  Do not drink liquids with your meals. Drink liquids between meals.  Avoid greasy, fatty, and spicy foods.  Get someone to cook for you if the smell of any food causes nausea and vomiting.  If you feel nauseous after taking prenatal vitamins, take the vitamins at night or with a snack.  Snack on protein  foods (nuts, yogurt, cheese) between meals if you are hungry.  Eat unsweetened gelatins for desserts.  Wearing an acupressure wristband (worn for sea sickness) may be helpful.  Acupuncture may be helpful.  Do not smoke.  Get a humidifier to keep the air in your house free of odors.  Get plenty of fresh air. SEEK MEDICAL CARE IF:   Your home remedies are not working, and you need medicine.  You feel dizzy or lightheaded.  You are losing weight. SEEK IMMEDIATE MEDICAL CARE IF:   You have persistent and uncontrolled nausea and vomiting.  You pass out (faint). MAKE SURE YOU:  Understand these instructions.  Will watch your condition.  Will get help right away if you are not doing well or get worse.   This information is not intended to replace advice given to you by your health care provider. Make sure you discuss any questions you have with your health care provider.   Document Released: 10/25/2006 Document Revised: 09/08/2013 Document Reviewed: 02/18/2013 Elsevier Interactive Patient Education 2016 [employer]    Hypokalemia Hypokalemia means that the amount of potassium in the blood is lower than normal.Potassium is a chemical, called an electrolyte, that helps regulate the amount of fluid in the body. It also stimulates muscle contraction and helps nerves function properly.Most of the body's potassium is inside of cells, and only a very small amount is in the blood. Because the amount in the blood is so small, minor changes can be life-threatening. CAUSES  Antibiotics.  Diarrhea or vomiting.  Using laxatives too much, which can cause diarrhea.  Chronic kidney disease.  Water pills (diuretics).  Eating disorders (bulimia).  Low magnesium level.  Sweating a lot. SIGNS AND SYMPTOMS  Weakness.  Constipation.  Fatigue.  Muscle cramps.  Mental confusion.  Skipped heartbeats or irregular heartbeat (palpitations).  Tingling or numbness. DIAGNOSIS   Your health care provider can diagnose hypokalemia with blood tests. In addition to checking your potassium level, your health care provider may also check other lab tests. TREATMENT Hypokalemia can be treated with potassium supplements taken by mouth or adjustments in your current medicines. If your potassium level is very low, you may need to get potassium through a vein (IV) and be monitored in the hospital. A diet high in potassium is also helpful. Foods high in potassium are:  Nuts, such as peanuts and pistachios.  Seeds, such as sunflower seeds and pumpkin seeds.  Peas, lentils, and lima beans.  Whole grain and bran cereals and breads.  Fresh fruit and vegetables, such as apricots, avocado, bananas, cantaloupe, kiwi, oranges, tomatoes, asparagus, and potatoes.  Orange and tomato juices.  Red meats.  Fruit yogurt. HOME CARE INSTRUCTIONS  Take all medicines as prescribed by your health care provider.  Maintain a healthy diet by including nutritious food, such as fruits, vegetables, nuts, whole grains, and lean meats.  If you are taking a laxative, be sure to follow the directions on the label. SEEK MEDICAL CARE IF:  Your weakness gets worse.  You feel your heart pounding or racing.  You are vomiting or having diarrhea.  You are diabetic and having trouble keeping your blood glucose in the normal range. SEEK IMMEDIATE MEDICAL CARE IF:  You have chest pain, shortness of breath, or dizziness.  You are vomiting or having diarrhea for more than 2 days.  You faint. MAKE SURE YOU:   Understand these instructions.  Will watch your condition.  Will get help right away if you are not doing well or get worse.   This information is not intended to replace advice given to you by your health care provider. Make sure you discuss any questions you have with your health care provider.   Document Released: 09/03/2005 Document Revised: 09/24/2014 Document Reviewed:  03/06/2013 Elsevier Interactive Patient Education [employer]

## 2016-03-19 NOTE — MAU Provider Note (Signed)
History     CSN: [id number]  Arrival date and time: 03/19/16 1545   First Provider Initiated Contact with Patient 03/19/16 1810    Chief Complaint  Patient presents with  . Morning Sickness   HPI Laney Silvestre GunnerMae Korte is a 25 y.o. G2P1001 at 2671w3d who presents with nausea/vomiting. Reports n/v throughout pregnancy; worse since the weekend. States has vomited 10+ times today & hasn't been able to keep down food x 2 days. Also endorses heartburn. Reglan called in for her this morning but hasn't been able to fill rx yet.  Denies abdominal pain, vaginal bleeding, fever, diarrhea, or constipation.  OB History    Gravida Para Term Preterm AB TAB SAB Ectopic Multiple Living   2 1 1       0 1      Past Medical History  Diagnosis Date  . Medical history non-contributory     Past Surgical History  Procedure Laterality Date  . No past surgeries      Family History  Problem Relation Age of Onset  . Hypertension Mother   . Hypertension Father     Social History  Substance Use Topics  . Smoking status: Never Smoker   . Smokeless tobacco: Never Used  . Alcohol Use: No    Allergies:  Allergies  Allergen Reactions  . Phenergan [Promethazine Hcl] Nausea And Vomiting    Prescriptions prior to admission  Medication Sig Dispense Refill Last Dose  . ibuprofen (ADVIL,MOTRIN) 200 MG tablet Take 400 mg by mouth every 6 (six) hours as needed for headache.   03/18/2016 at Unknown time  . folic acid (FOLVITE) 1 MG tablet Take 1 tablet (1 mg total) by mouth daily. (Patient not taking: Reported on 03/19/2016) 30 tablet 1 Not Taking at Unknown time  . metoCLOPramide (REGLAN) 10 MG tablet Take 1 tablet (10 mg total) by mouth every 6 (six) hours. 30 tablet 2 Has not started    Review of Systems  Constitutional: Negative.   Gastrointestinal: Positive for heartburn, nausea and vomiting. Negative for abdominal pain, diarrhea and constipation.  Genitourinary: Negative.    Physical Exam   Blood  pressure 122/65, pulse 78, resp. rate 18, height 4\' 8"  (1.422 m), weight 100 lb 9.6 oz (45.632 kg), last menstrual period 01/21/2016, currently breastfeeding.  Physical Exam  Nursing note and vitals reviewed. Constitutional: She is oriented to person, place, and time. She appears well-developed and well-nourished. No distress.  HENT:  Head: Normocephalic and atraumatic.  Eyes: Conjunctivae are normal. Right eye exhibits no discharge. Left eye exhibits no discharge. No scleral icterus.  Neck: Normal range of motion.  Cardiovascular: Normal rate, regular rhythm and normal heart sounds.   No murmur heard. Respiratory: Effort normal and breath sounds normal. No respiratory distress. She has no wheezes.  GI: Soft. Bowel sounds are normal. She exhibits no distension. There is no tenderness.  Neurological: She is alert and oriented to person, place, and time.  Skin: Skin is warm and dry. She is not diaphoretic.  Psychiatric: She has a normal mood and affect. Her behavior is normal. Judgment and thought content normal.    MAU Course  Procedures Results for orders placed or performed during the hospital encounter of 03/19/16 (from the past 24 hour(s))  Urinalysis, Routine w reflex microscopic ([facility])     Status: Abnormal   Collection Time: 03/19/16  4:40 PM  Result Value Ref Range   Color, Urine YELLOW YELLOW   APPearance HAZY (A) CLEAR   Specific  Gravity, Urine >1.030 (H) 1.005 - 1.030   pH 6.0 5.0 - 8.0   Glucose, UA NEGATIVE NEGATIVE mg/dL   Hgb urine dipstick LARGE (A) NEGATIVE   Bilirubin Urine SMALL (A) NEGATIVE   Ketones, ur 15 (A) NEGATIVE mg/dL   Protein, ur 409100 (A) NEGATIVE mg/dL   Nitrite NEGATIVE NEGATIVE   Leukocytes, UA TRACE (A) NEGATIVE  Urine microscopic-add on     Status: Abnormal   Collection Time: 03/19/16  4:40 PM  Result Value Ref Range   Squamous Epithelial / LPF 6-30 (A) NONE SEEN   WBC, UA 6-30 0 - 5 WBC/hpf   RBC / HPF 6-30 0 - 5 RBC/hpf   Bacteria, UA  MANY (A) NONE SEEN  CBC     Status: None   Collection Time: 03/19/16  6:45 PM  Result Value Ref Range   WBC 8.7 4.0 - 10.5 K/uL   RBC 4.66 3.87 - 5.11 MIL/uL   Hemoglobin 13.1 12.0 - 15.0 g/dL   HCT 81.138.0 91.436.0 - 78.246.0 %   MCV 81.5 78.0 - 100.0 fL   MCH 28.1 26.0 - 34.0 pg   MCHC 34.5 30.0 - 36.0 g/dL   RDW 95.612.8 21.311.5 - 08.615.5 %   Platelets 365 150 - 400 K/uL  Comprehensive metabolic panel     Status: Abnormal   Collection Time: 03/19/16  7:22 PM  Result Value Ref Range   Sodium 131 (L) 135 - 145 mmol/L   Potassium 3.1 (L) 3.5 - 5.1 mmol/L   Chloride 99 (L) 101 - 111 mmol/L   CO2 23 22 - 32 mmol/L   Glucose, Bld 368 (H) 65 - 99 mg/dL   BUN 10 6 - 20 mg/dL   Creatinine, Ser 5.780.65 0.44 - 1.00 mg/dL   Calcium 8.7 (L) 8.9 - 10.3 mg/dL   Total Protein 6.4 (L) 6.5 - 8.1 g/dL   Albumin 2.9 (L) 3.5 - 5.0 g/dL   AST 28 15 - 41 U/L   ALT 21 14 - 54 U/L   Alkaline Phosphatase 32 (L) 38 - 126 U/L   Total Bilirubin 0.6 0.3 - 1.2 mg/dL   GFR calc non Af Amer >60 >60 mL/min   GFR calc Af Amer >60 >60 mL/min   Anion gap 9 5 - 15  Glucose, capillary     Status: Abnormal   Collection Time: 03/19/16  8:25 PM  Result Value Ref Range   Glucose-Capillary 119 (H) 65 - 99 mg/dL    MDM U/a shows ketones & >1.030 SG -- will IV hydrate CBC, CMP D5LR x 1 bag Reglan 10 mg IV Original CMP tube clotted off causing phlebotomy to draw second tube -- blood draw done after D5LR fluid bolus -- I attribute elevated serum glucose to timing of CMP blood draw  CBG 119 Pt reports improvement in symptoms & requesting to go home Assessment and Plan  A: 1. Nausea and vomiting during pregnancy prior to [redacted] weeks gestation   2. Heartburn during pregnancy in first trimester   3. Hypokalemia    P: Discharge home Rx kdur & zantac Fill rx reglan Discussed diet plan for n/v Discussed reasons to return to MAU Start prenatal care  Judeth Hornrin Codie Hainer 03/19/2016, 6:10 PM

## 2016-03-19 NOTE — MAU Note (Signed)
Pt presents to MAU with complaints of nausea and being dehydrated. Denies any vaginal bleeding or abnormal discharge

## 2016-05-14 ENCOUNTER — Encounter: Payer: Self-pay | Admitting: Obstetrics and Gynecology

## 2016-06-04 ENCOUNTER — Ambulatory Visit (INDEPENDENT_AMBULATORY_CARE_PROVIDER_SITE_OTHER): Payer: Medicaid Other | Admitting: Obstetrics and Gynecology

## 2016-06-04 ENCOUNTER — Other Ambulatory Visit: Payer: Self-pay | Admitting: Obstetrics and Gynecology

## 2016-06-04 ENCOUNTER — Encounter: Payer: Self-pay | Admitting: Obstetrics and Gynecology

## 2016-06-04 ENCOUNTER — Other Ambulatory Visit (HOSPITAL_COMMUNITY)
Admission: RE | Admit: 2016-06-04 | Discharge: 2016-06-04 | Disposition: A | Discharge status: Home / Self Care | Payer: Medicaid Other | Source: Ambulatory Visit | Attending: Obstetrics and Gynecology | Admitting: Obstetrics and Gynecology

## 2016-06-04 VITALS — BP 109/66 | HR 88 | Wt 98.0 lb

## 2016-06-04 DIAGNOSIS — Z36 Encounter for antenatal screening of mother: Secondary | ICD-10-CM | POA: Diagnosis not present

## 2016-06-04 DIAGNOSIS — Z113 Encounter for screening for infections with a predominantly sexual mode of transmission: Secondary | ICD-10-CM

## 2016-06-04 DIAGNOSIS — Z3492 Encounter for supervision of normal pregnancy, unspecified, second trimester: Secondary | ICD-10-CM

## 2016-06-04 DIAGNOSIS — Z3490 Encounter for supervision of normal pregnancy, unspecified, unspecified trimester: Secondary | ICD-10-CM

## 2016-06-04 LAB — COMPREHENSIVE METABOLIC PANEL
ALBUMIN: 3.7 g/dL (ref 3.6–5.1)
ALT: 5 U/L — ABNORMAL LOW (ref 6–29)
AST: 13 U/L (ref 10–30)
Alkaline Phosphatase: 36 U/L (ref 33–115)
BILIRUBIN TOTAL: 0.2 mg/dL (ref 0.2–1.2)
BUN: 8 mg/dL (ref 7–25)
CO2: 22 mmol/L (ref 20–31)
CREATININE: 0.43 mg/dL — AB (ref 0.50–1.10)
Calcium: 9.6 mg/dL (ref 8.6–10.2)
Chloride: 101 mmol/L (ref 98–110)
Glucose, Bld: 81 mg/dL (ref 65–99)
Potassium: 4.2 mmol/L (ref 3.5–5.3)
SODIUM: 132 mmol/L — AB (ref 135–146)
TOTAL PROTEIN: 7.3 g/dL (ref 6.1–8.1)

## 2016-06-04 NOTE — Progress Notes (Signed)
Bedside ultrasound FL: 3.04 c/w EDD: 10-27-16. Gestational age 919.2 weeks. FHR:154 bpm. Armandina StammerJennifer Howard RNBSN

## 2016-06-04 NOTE — Progress Notes (Signed)
Subjective:  Alison Hooper is a 25 y.o. G2P1001 at 14w2dbeing seen today for initial prenatal care.  She is currently monitored for the following issues for this low-risk pregnancy and  does not have any active problems on file.  Patient reports no complaints.  Contractions: Not present. Vag. Bleeding: None.  Movement: Present. Denies leaking of fluid.   The following portions of the patient's history were reviewed and updated as appropriate: allergies, current medications, past family history, past medical history, past social history, past surgical history and problem list. Problem list updated.  Objective:   Vitals:   06/04/16 0924  BP: 109/66  Pulse: 88  Weight: 98 lb (44.5 kg)    Fetal Status: Fetal Heart Rate (bpm): 154   Movement: Present     General:  Alert, oriented and cooperative. Patient is in no acute distress.  Skin: Skin is warm and dry. No rash noted.   Cardiovascular: Normal heart rate noted  Respiratory: Normal respiratory effort, no problems with respiration noted  Abdomen: Soft, gravid, appropriate for gestational age. Pain/Pressure: Absent     Pelvic:  Cervical exam deferred        Extremities: Normal range of motion.  Edema: None  Mental Status: Normal mood and affect. Normal behavior. Normal judgment and thought content.   Urinalysis: Urine Protein: Negative Urine Glucose: Negative  Assessment and Plan:  Pregnancy: G2P1001 at 157w2d1. Prenatal care, unspecified trimester  - [place] OB COMP + 14 WK; Future - CULTURE, URINE COMPREHENSIVE - HIV antibody (with reflex) - GC[facility]Chlamydia probe amp [facility], Urine - Prenatal (OB Panel) - Comp Met (CMET) - Antinuclear Antib (ANA) - Quad screen  Preterm labor symptoms and general obstetric precautions including but not limited to vaginal bleeding, contractions, leaking of fluid and fetal movement were reviewed in detail with the patient. Please refer to After Visit Summary for  other counseling recommendations.  No Follow-up on file.   MiChancy MilroyMD

## 2016-06-04 NOTE — Patient Instructions (Signed)

## 2016-06-05 LAB — AFP, QUAD SCREEN
AFP: 46.5 ng/mL
Age Alone: 1:1030 {titer}
CURR GEST AGE: 19.3 wk
Down Syndrome Scr Risk Est: 1:2100 {titer}
HCG, Total: 40.4 IU/mL
INH: 250 pg/mL
INTERPRETATION-AFP: NEGATIVE
MOM FOR AFP: 0.74
MOM FOR HCG: 1.43
MOM FOR INH: 1.19
OPEN SPINA BIFIDA: NEGATIVE
Osb Risk: <1:27300 {titer}
TRI 18 SCR RISK EST: NEGATIVE
uE3 Mom: 1.3
uE3 Value: 2.27 ng/mL

## 2016-06-05 LAB — OBSTETRIC PANEL
ANTIBODY SCREEN: NEGATIVE
BASOS ABS: 0 {cells}/uL (ref 0–200)
Basophils Relative: 0 %
EOS ABS: 160 {cells}/uL (ref 15–500)
Eosinophils Relative: 2 %
HCT: 34 % — ABNORMAL LOW (ref 35.0–45.0)
HEMOGLOBIN: 11.2 g/dL — AB (ref 11.7–15.5)
Hepatitis B Surface Ag: NEGATIVE
LYMPHS ABS: 1600 {cells}/uL (ref 850–3900)
LYMPHS PCT: 20 %
MCH: 28.4 pg (ref 27.0–33.0)
MCHC: 32.9 g/dL (ref 32.0–36.0)
MCV: 86.3 fL (ref 80.0–100.0)
MONOS PCT: 5 %
MPV: 9.6 fL (ref 7.5–12.5)
Monocytes Absolute: 400 cells/uL (ref 200–950)
NEUTROS ABS: 5840 {cells}/uL (ref 1500–7800)
NEUTROS PCT: 73 %
Platelets: 341 10*3/uL (ref 140–400)
RBC: 3.94 MIL/uL (ref 3.80–5.10)
RDW: 14.8 % (ref 11.0–15.0)
RH TYPE: POSITIVE
RUBELLA: 5.16 {index} — AB (ref <0.90)
WBC: 8 10*3/uL (ref 3.8–10.8)

## 2016-06-05 LAB — GC/CHLAMYDIA PROBE AMP (~~LOC~~) NOT AT ARMC
CHLAMYDIA, DNA PROBE: NEGATIVE
Neisseria Gonorrhea: NEGATIVE

## 2016-06-05 LAB — HIV ANTIBODY (ROUTINE TESTING W REFLEX): HIV 1&2 Ab, 4th Generation: NONREACTIVE

## 2016-06-05 LAB — ANA: ANA: NEGATIVE

## 2016-06-06 LAB — CULTURE, URINE COMPREHENSIVE

## 2016-06-08 ENCOUNTER — Telehealth: Payer: Self-pay

## 2016-06-08 NOTE — Telephone Encounter (Signed)
Left message for patient to return call to office.  Jennifer Howard RNBSN 

## 2016-06-08 NOTE — Telephone Encounter (Signed)
-----   Message from Hermina StaggersMichael L Ervin, MD sent at 06/07/2016  2:58 PM EDT ----- Please notify pt that labs are normal for pregnancy Thanks Casimiro NeedleMichael ----- Message ----- From: Interface, Lab In Three [facility] Five Sent: 06/04/2016  10:24 PM To: Hermina StaggersMichael L Ervin, MD

## 2016-06-11 ENCOUNTER — Ambulatory Visit (HOSPITAL_COMMUNITY)
Admission: RE | Admit: 2016-06-11 | Discharge: 2016-06-11 | Disposition: A | Discharge status: Home / Self Care | Payer: Medicaid Other | Source: Ambulatory Visit | Attending: Obstetrics and Gynecology | Admitting: Obstetrics and Gynecology

## 2016-06-11 DIAGNOSIS — Z36 Encounter for antenatal screening of mother: Secondary | ICD-10-CM | POA: Diagnosis not present

## 2016-06-11 DIAGNOSIS — O09892 Supervision of other high risk pregnancies, second trimester: Secondary | ICD-10-CM | POA: Insufficient documentation

## 2016-06-11 DIAGNOSIS — Z3490 Encounter for supervision of normal pregnancy, unspecified, unspecified trimester: Secondary | ICD-10-CM

## 2016-06-11 DIAGNOSIS — Z3A20 20 weeks gestation of pregnancy: Secondary | ICD-10-CM | POA: Insufficient documentation

## 2016-06-18 LAB — DRUG ABUSE PANEL 10-50 NO CONF, U
AMPHETAMINES (1000 NG/ML SCRN): NEGATIVE
BARBITURATES: NEGATIVE
BENZODIAZEPINES: NEGATIVE
COCAINE METABOLITES: NEGATIVE
MARIJUANA MET (50 NG/ML SCRN): NEGATIVE
METHADONE: NEGATIVE
METHAQUALONE: NEGATIVE
OPIATES: NEGATIVE
PHENCYCLIDINE: NEGATIVE
PROPOXYPHENE: NEGATIVE

## 2016-07-19 ENCOUNTER — Ambulatory Visit (INDEPENDENT_AMBULATORY_CARE_PROVIDER_SITE_OTHER): Payer: Medicaid Other | Admitting: Obstetrics & Gynecology

## 2016-07-19 DIAGNOSIS — Z3480 Encounter for supervision of other normal pregnancy, unspecified trimester: Secondary | ICD-10-CM | POA: Insufficient documentation

## 2016-07-19 DIAGNOSIS — Z23 Encounter for immunization: Secondary | ICD-10-CM

## 2016-07-19 DIAGNOSIS — O0932 Supervision of pregnancy with insufficient antenatal care, second trimester: Secondary | ICD-10-CM

## 2016-07-19 DIAGNOSIS — O0930 Supervision of pregnancy with insufficient antenatal care, unspecified trimester: Secondary | ICD-10-CM | POA: Insufficient documentation

## 2016-07-19 DIAGNOSIS — O09899 Supervision of other high risk pregnancies, unspecified trimester: Secondary | ICD-10-CM

## 2016-07-19 NOTE — Progress Notes (Signed)
   PRENATAL VISIT NOTE  Subjective:  Alison Hooper is a 25 y.o. G2P1001 at 678w5d being seen today for ongoing prenatal care.  She is currently monitored for the following issues for this low-risk pregnancy and has Supervision of other normal pregnancy, antepartum; Short interval between pregnancies affecting pregnancy, antepartum; and Late prenatal care affecting pregnancy on her problem list.  Patient reports no complaints.  Contractions: Not present. Vag. Bleeding: None.  Movement: Present. Denies leaking of fluid.   The following portions of the patient's history were reviewed and updated as appropriate: allergies, current medications, past family history, past medical history, past social history, past surgical history and problem list. Problem list updated.  Objective:   Vitals:   07/19/16 1014  BP: 112/69  Pulse: 87  Weight: 106 lb (48.1 kg)    Fetal Status: Fetal Heart Rate (bpm): 138   Movement: Present     General:  Alert, oriented and cooperative. Patient is in no acute distress.  Skin: Skin is warm and dry. No rash noted.   Cardiovascular: Normal heart rate noted  Respiratory: Normal respiratory effort, no problems with respiration noted  Abdomen: Soft, gravid, appropriate for gestational age. Pain/Pressure: Present     Pelvic:  Cervical exam deferred        Extremities: Normal range of motion.  Edema: None  Mental Status: Normal mood and affect. Normal behavior. Normal judgment and thought content.   Assessment and Plan:  Pregnancy: G2P1001 at 218w5d  1. Supervision of other normal pregnancy, antepartum Reviewed all PNL from last visit inculding genetic testing   2. Short interval between pregnancies affecting pregnancy, antepartum Flu vaccine  Tdap today  3. Late prenatal care affecting pregnancy in second trimester   Preterm labor symptoms and general obstetric precautions including but not limited to vaginal bleeding, contractions, leaking of fluid and fetal  movement were reviewed in detail with the patient. Please refer to After Visit Summary for other counseling recommendations.  Return in about 4 weeks (around 08/16/2016).  Willodean Rosenthalarolyn Harraway-Smith, MD

## 2016-07-19 NOTE — Patient Instructions (Signed)

## 2016-08-16 ENCOUNTER — Ambulatory Visit (INDEPENDENT_AMBULATORY_CARE_PROVIDER_SITE_OTHER): Payer: Medicaid Other | Admitting: Obstetrics & Gynecology

## 2016-08-16 VITALS — BP 105/78 | HR 83 | Wt 111.0 lb

## 2016-08-16 DIAGNOSIS — O09899 Supervision of other high risk pregnancies, unspecified trimester: Secondary | ICD-10-CM

## 2016-08-16 DIAGNOSIS — O0932 Supervision of pregnancy with insufficient antenatal care, second trimester: Secondary | ICD-10-CM

## 2016-08-16 DIAGNOSIS — Z3490 Encounter for supervision of normal pregnancy, unspecified, unspecified trimester: Secondary | ICD-10-CM

## 2016-08-16 DIAGNOSIS — Z3480 Encounter for supervision of other normal pregnancy, unspecified trimester: Secondary | ICD-10-CM

## 2016-08-16 LAB — CBC
HEMATOCRIT: 35.1 % (ref 35.0–45.0)
HEMOGLOBIN: 11.6 g/dL — AB (ref 11.7–15.5)
MCH: 29.2 pg (ref 27.0–33.0)
MCHC: 33 g/dL (ref 32.0–36.0)
MCV: 88.4 fL (ref 80.0–100.0)
MPV: 9.8 fL (ref 7.5–12.5)
Platelets: 322 10*3/uL (ref 140–400)
RBC: 3.97 MIL/uL (ref 3.80–5.10)
RDW: 13.4 % (ref 11.0–15.0)
WBC: 8.6 10*3/uL (ref 3.8–10.8)

## 2016-08-16 NOTE — Progress Notes (Signed)
   PRENATAL VISIT NOTE  Subjective:  Alison Hooper is a 25 y.o. MH  G2P1001 at 7533w5d being seen today for ongoing prenatal care.  She is currently monitored for the following issues for this low-risk pregnancy and has Supervision of other normal pregnancy, antepartum; Short interval between pregnancies affecting pregnancy, antepartum; and Late prenatal care affecting pregnancy on her problem list.  Patient reports no complaints.  Contractions: Not present. Vag. Bleeding: None.  Movement: Present. Denies leaking of fluid.   The following portions of the patient's history were reviewed and updated as appropriate: allergies, current medications, past family history, past medical history, past social history, past surgical history and problem list. Problem list updated.  Objective:   Vitals:   08/16/16 0928  BP: 105/78  Pulse: 83  Weight: 111 lb (50.3 kg)    Fetal Status:     Movement: Present     General:  Alert, oriented and cooperative. Patient is in no acute distress.  Skin: Skin is warm and dry. No rash noted.   Cardiovascular: Normal heart rate noted  Respiratory: Normal respiratory effort, no problems with respiration noted  Abdomen: Soft, gravid, appropriate for gestational age. Pain/Pressure: Present     Pelvic:  Cervical exam deferred        Extremities: Normal range of motion.  Edema: None  Mental Status: Normal mood and affect. Normal behavior. Normal judgment and thought content.   Assessment and Plan:  Pregnancy: G2P1001 at 3033w5d  1. Prenatal care, antepartum  - Glucose Tolerance, 1 HR (50g) - RPR - HIV antibody (with reflex) - CBC  2. Late prenatal care affecting pregnancy in second trimester   3. Short interval between pregnancies affecting pregnancy, antepartum   4. Supervision of other normal pregnancy, antepartum   Preterm labor symptoms and general obstetric precautions including but not limited to vaginal bleeding, contractions, leaking of fluid and  fetal movement were reviewed in detail with the patient. Please refer to After Visit Summary for other counseling recommendations.  Return in about 3 weeks (around 09/06/2016).   Allie BossierMyra C Macenzie Burford, MD

## 2016-08-17 LAB — HIV ANTIBODY (ROUTINE TESTING W REFLEX): HIV: NONREACTIVE

## 2016-08-17 LAB — RPR

## 2016-08-17 LAB — GLUCOSE TOLERANCE, 1 HOUR (50G) W/O FASTING: Glucose, 1 Hr, gestational: 94 mg/dL (ref <140)

## 2016-09-17 NOTE — L&D Delivery Note (Signed)
Delivery Note I was called to MAU to attend imminent delivery. After 3 contractions, at 5:12 AM a viable female was delivered via Vaginal, Spontaneous Delivery (Presentation: ;  ).  APGAR: 9, 9; weight  Pending.  After 1 minute, the cord was clamped and cut. 40 units of pitocin diluted in 1000cc LR was infused rapidly IV.  The placenta separated spontaneously and delivered via CCT and maternal pushing effort.  It was inspected and appears to be intact with a 3 VC.    Anesthesia:  none Episiotomy: None Lacerations: None Suture Repair: n/a Est. Blood Loss (mL): 75  Mom to postpartum.  Baby to Couplet care / Skin to Skin.  CRESENZO-DISHMAN,Temima Kutsch 10/26/2016, 5:47 AM

## 2016-09-26 ENCOUNTER — Encounter: Payer: Medicaid Other | Admitting: Family Medicine

## 2016-10-03 ENCOUNTER — Encounter: Payer: Medicaid Other | Admitting: Family Medicine

## 2016-10-10 ENCOUNTER — Ambulatory Visit (INDEPENDENT_AMBULATORY_CARE_PROVIDER_SITE_OTHER): Payer: Medicaid Other | Admitting: Family Medicine

## 2016-10-10 ENCOUNTER — Other Ambulatory Visit (HOSPITAL_COMMUNITY)
Admission: RE | Admit: 2016-10-10 | Discharge: 2016-10-10 | Disposition: A | Discharge status: Home / Self Care | Payer: Medicaid Other | Source: Ambulatory Visit | Attending: Family Medicine | Admitting: Family Medicine

## 2016-10-10 VITALS — BP 123/63 | HR 93 | Wt 116.0 lb

## 2016-10-10 DIAGNOSIS — O0933 Supervision of pregnancy with insufficient antenatal care, third trimester: Secondary | ICD-10-CM

## 2016-10-10 DIAGNOSIS — O26843 Uterine size-date discrepancy, third trimester: Secondary | ICD-10-CM

## 2016-10-10 DIAGNOSIS — Z113 Encounter for screening for infections with a predominantly sexual mode of transmission: Secondary | ICD-10-CM | POA: Diagnosis present

## 2016-10-10 DIAGNOSIS — O09899 Supervision of other high risk pregnancies, unspecified trimester: Secondary | ICD-10-CM

## 2016-10-10 DIAGNOSIS — Z3490 Encounter for supervision of normal pregnancy, unspecified, unspecified trimester: Secondary | ICD-10-CM

## 2016-10-10 NOTE — Progress Notes (Signed)
   PRENATAL VISIT NOTE  Subjective:  Alison Hooper is a 26 y.o. G2P1001 at 8770w4d being seen today for ongoing prenatal care.  She is currently monitored for the following issues for this low-risk pregnancy and has Supervision of other normal pregnancy, antepartum; Short interval between pregnancies affecting pregnancy, antepartum; and Late prenatal care affecting pregnancy on her problem list.  Patient reports no complaints.  Contractions: Irritability. Vag. Bleeding: None.  Movement: Present. Denies leaking of fluid.   The following portions of the patient's history were reviewed and updated as appropriate: allergies, current medications, past family history, past medical history, past social history, past surgical history and problem list. Problem list updated.  Objective:   Vitals:   10/10/16 1523  BP: 123/63  Pulse: 93  Weight: 116 lb (52.6 kg)    Fetal Status: Fetal Heart Rate (bpm): 135 Fundal Height: 34 cm Movement: Present     General:  Alert, oriented and cooperative. Patient is in no acute distress.  Skin: Skin is warm and dry. No rash noted.   Cardiovascular: Normal heart rate noted  Respiratory: Normal respiratory effort, no problems with respiration noted  Abdomen: Soft, gravid, appropriate for gestational age. Pain/Pressure: Present     Pelvic:  Cervical exam deferred        Extremities: Normal range of motion.  Edema: None  Mental Status: Normal mood and affect. Normal behavior. Normal judgment and thought content.   Assessment and Plan:  Pregnancy: G2P1001 at 6070w4d  1. Prenatal care, antepartum FHT normal.  - Culture, beta strep (group b only) - GC/Chlamydia probe amp ([place])not at [facility]  2. Insufficient prenatal care in third trimester  3. Size of fetus inconsistent with dates in third trimester Will get US for growth  4. Short interval between pregnancies affecting pregnancy, antepartum    Term labor symptoms and general obstetric precautions  including but not limited to vaginal bleeding, contractions, leaking of fluid and fetal movement were reviewed in detail with the patient. Please refer to After Visit Summary for other counseling recommendations.  No Follow-up on file.   Levie HeritageJacob J Tarra Pence, DO

## 2016-10-12 LAB — GC/CHLAMYDIA PROBE AMP (~~LOC~~) NOT AT ARMC
Chlamydia: NEGATIVE
Neisseria Gonorrhea: NEGATIVE

## 2016-10-13 LAB — CULTURE, BETA STREP (GROUP B ONLY): STREP GP B CULTURE: POSITIVE — AB

## 2016-10-16 ENCOUNTER — Ambulatory Visit (HOSPITAL_COMMUNITY)
Admission: RE | Admit: 2016-10-16 | Discharge: 2016-10-16 | Disposition: A | Discharge status: Home / Self Care | Payer: Medicaid Other | Source: Ambulatory Visit | Attending: Family Medicine | Admitting: Family Medicine

## 2016-10-16 ENCOUNTER — Other Ambulatory Visit: Payer: Self-pay | Admitting: Family Medicine

## 2016-10-16 DIAGNOSIS — Z3A38 38 weeks gestation of pregnancy: Secondary | ICD-10-CM | POA: Diagnosis not present

## 2016-10-16 DIAGNOSIS — O09893 Supervision of other high risk pregnancies, third trimester: Secondary | ICD-10-CM | POA: Insufficient documentation

## 2016-10-16 DIAGNOSIS — O26843 Uterine size-date discrepancy, third trimester: Secondary | ICD-10-CM | POA: Diagnosis not present

## 2016-10-17 ENCOUNTER — Ambulatory Visit (INDEPENDENT_AMBULATORY_CARE_PROVIDER_SITE_OTHER): Payer: Medicaid Other | Admitting: Family Medicine

## 2016-10-17 VITALS — BP 130/73 | HR 91 | Wt 116.0 lb

## 2016-10-17 DIAGNOSIS — O09899 Supervision of other high risk pregnancies, unspecified trimester: Secondary | ICD-10-CM

## 2016-10-17 DIAGNOSIS — Z3480 Encounter for supervision of other normal pregnancy, unspecified trimester: Secondary | ICD-10-CM

## 2016-10-17 DIAGNOSIS — Z3483 Encounter for supervision of other normal pregnancy, third trimester: Secondary | ICD-10-CM

## 2016-10-17 NOTE — Patient Instructions (Signed)
Levonorgestrel intrauterine device (IUD) °What is this medicine? °LEVONORGESTREL IUD (LEE voe nor jes trel) is a contraceptive (birth control) device. The device is placed inside the uterus by a healthcare professional. It is used to prevent pregnancy. This device can also be used to treat heavy bleeding that occurs during your period. °This medicine may be used for other purposes; ask your health care provider or pharmacist if you have questions. °COMMON BRAND NAME(S): Kyleena, LILETTA, Mirena, Skyla °What should I tell my health care provider before I take this medicine? °They need to know if you have any of these conditions: °-abnormal Pap smear °-cancer of the breast, uterus, or cervix °-diabetes °-endometritis °-genital or pelvic infection now or in the past °-have more than one sexual partner or your partner has more than one partner °-heart disease °-history of an ectopic or tubal pregnancy °-immune system problems °-IUD in place °-liver disease or tumor °-problems with blood clots or take blood-thinners °-seizures °-use intravenous drugs °-uterus of unusual shape °-vaginal bleeding that has not been explained °-an unusual or allergic reaction to levonorgestrel, other hormones, silicone, or polyethylene, medicines, foods, dyes, or preservatives °-pregnant or trying to get pregnant °-breast-feeding °How should I use this medicine? °This device is placed inside the uterus by a health care professional. °Talk to your pediatrician regarding the use of this medicine in children. Special care may be needed. °Overdosage: If you think you have taken too much of this medicine contact a poison control center or emergency room at once. °NOTE: This medicine is only for you. Do not share this medicine with others. °What if I miss a dose? °This does not apply. Depending on the brand of device you have inserted, the device will need to be replaced every 3 to 5 years if you wish to continue using this type of birth  control. °What may interact with this medicine? °Do not take this medicine with any of the following medications: °-amprenavir °-bosentan °-fosamprenavir °This medicine may also interact with the following medications: °-aprepitant °-barbiturate medicines for inducing sleep or treating seizures °-bexarotene °-griseofulvin °-medicines to treat seizures like carbamazepine, ethotoin, felbamate, oxcarbazepine, phenytoin, topiramate °-modafinil °-pioglitazone °-rifabutin °-rifampin °-rifapentine °-some medicines to treat HIV infection like atazanavir, indinavir, lopinavir, nelfinavir, tipranavir, ritonavir °-St. John's wort °-warfarin °This list may not describe all possible interactions. Give your health care provider a list of all the medicines, herbs, non-prescription drugs, or dietary supplements you use. Also tell them if you smoke, drink alcohol, or use illegal drugs. Some items may interact with your medicine. °What should I watch for while using this medicine? °Visit your doctor or health care professional for regular check ups. See your doctor if you or your partner has sexual contact with others, becomes HIV positive, or gets a sexual transmitted disease. °This product does not protect you against HIV infection (AIDS) or other sexually transmitted diseases. °You can check the placement of the IUD yourself by reaching up to the top of your vagina with clean fingers to feel the threads. Do not pull on the threads. It is a good habit to check placement after each menstrual period. Call your doctor right away if you feel more of the IUD than just the threads or if you cannot feel the threads at all. °The IUD may come out by itself. You may become pregnant if the device comes out. If you notice that the IUD has come out use a backup birth control method like condoms and call your health care provider. °  Using tampons will not change the position of the IUD and are okay to use during your period. °This IUD can be  safely scanned with magnetic resonance imaging (MRI) only under specific conditions. Before you have an MRI, tell your healthcare provider that you have an IUD in place, and which type of IUD you have in place. °What side effects may I notice from receiving this medicine? °Side effects that you should report to your doctor or health care professional as soon as possible: °-allergic reactions like skin rash, itching or hives, swelling of the face, lips, or tongue °-fever, flu-like symptoms °-genital sores °-high blood pressure °-no menstrual period for 6 weeks during use °-pain, swelling, warmth in the leg °-pelvic pain or tenderness °-severe or sudden headache °-signs of pregnancy °-stomach cramping °-sudden shortness of breath °-trouble with balance, talking, or walking °-unusual vaginal bleeding, discharge °-yellowing of the eyes or skin °Side effects that usually do not require medical attention (report to your doctor or health care professional if they continue or are bothersome): °-acne °-breast pain °-change in sex drive or performance °-changes in weight °-cramping, dizziness, or faintness while the device is being inserted °-headache °-irregular menstrual bleeding within first 3 to 6 months of use °-nausea °This list may not describe all possible side effects. Call your doctor for medical advice about side effects. You may report side effects to FDA at 1-800-FDA-1088. °Where should I keep my medicine? °This does not apply. °NOTE: This sheet is a summary. It may not cover all possible information. If you have questions about this medicine, talk to your doctor, pharmacist, or health care provider. °© 2017 Elsevier/Gold Standard (2016-02-24 13:46:37) °Contraceptive Implant Information °A contraceptive implant is a plastic rod that is inserted under your skin. It is usually inserted under the skin of your upper arm. It continually releases small amounts of progestin (synthetic progesterone) into your  bloodstream. This prevents an egg from being released from your ovaries. It also thickens your cervical mucus to prevent sperm from entering the cervix, and it thins your uterine lining to prevent a fertilized egg from attaching to your uterus. Contraceptive implants can be effective for up to 3 years. They do not provide protection against sexually transmitted diseases (STDs).  °The procedure to insert an implant usually takes about 10 minutes. There may be minor bruising, swelling, and discomfort at the insertion site for a couple days. The implant begins to work within the first day. Other contraceptive protection may be necessary for 7 days. Be sure to discuss with your health care provider if you need a backup method of contraception.  °Your health care provider will make sure you are a good candidate for the contraceptive implant. Discuss with your health care provider the possible side effects of the implant. °ADVANTAGES °· It prevents pregnancy for up to 3 years. °· It is easily reversible. °· It is convenient. °· It can be used when breastfeeding. °· It can be used by women who cannot take estrogen. °DISADVANTAGES °· You may have irregular or unplanned vaginal bleeding. °· You may develop side effects, including headache, weight gain, acne, breast tenderness, or mood changes. °· You may have tissue or nerve damage after insertion (rare). °· It may be difficult and uncomfortable to remove. °· Certain medicines may interfere with the effectiveness of the implant.  °REMOVAL OF IMPLANT °The implant should be removed in 3 years or as directed by your health care provider. The implant's effect wears off in a few hours   after removal. Your ability to get pregnant (fertility) may be restored in 1-2 weeks. A new implant can be inserted as soon as the old one is removed if desired. °CONTRAINDICATIONS °You should not get the implant if you are experiencing any of the following situations: °· You are pregnant. °· You  have a history of breast cancer, osteoporosis, blood clots, heart disease, diabetes, high blood pressure, liver disease, tumors, or stroke.   °· You have undiagnosed vaginal bleeding. °· You have a sensitivity to any part of the implant. °This information is not intended to replace advice given to you by your health care provider. Make sure you discuss any questions you have with your health care provider. °Document Released: 08/23/2011 Document Revised: 05/06/2013 Document Reviewed: 03/02/2013 °Elsevier Interactive Patient Education © 2017 Elsevier Inc. ° °

## 2016-10-17 NOTE — Progress Notes (Signed)
   PRENATAL VISIT NOTE  Subjective:  Alison Hooper is a 26 y.o. G2P1001 at 7154w4d being seen today for ongoing prenatal care.  She is currently monitored for the following issues for this low-risk pregnancy and has Supervision of other normal pregnancy, antepartum; Short interval between pregnancies affecting pregnancy, antepartum; and Late prenatal care affecting pregnancy on her problem list.  Patient reports occasional contractions.  Contractions: Not present. Vag. Bleeding: None.  Movement: Present. Denies leaking of fluid.   The following portions of the patient's history were reviewed and updated as appropriate: allergies, current medications, past family history, past medical history, past social history, past surgical history and problem list. Problem list updated.  Objective:   Vitals:   10/17/16 1447  BP: 130/73  Pulse: 91  Weight: 116 lb (52.6 kg)    Fetal Status: Fetal Heart Rate (bpm): 141   Movement: Present     General:  Alert, oriented and cooperative. Patient is in no acute distress.  Skin: Skin is warm and dry. No rash noted.   Cardiovascular: Normal heart rate noted  Respiratory: Normal respiratory effort, no problems with respiration noted  Abdomen: Soft, gravid, appropriate for gestational age. Pain/Pressure: Present     Pelvic:  Cervical exam deferred        Extremities: Normal range of motion.  Edema: None  Mental Status: Normal mood and affect. Normal behavior. Normal judgment and thought content.   Assessment and Plan:  Pregnancy: G2P1001 at 5254w4d  1. Supervision of other normal pregnancy, antepartum FHT normal. EFW 6# (19%)  2. Short interval between pregnancies affecting pregnancy, antepartum   Term labor symptoms and general obstetric precautions including but not limited to vaginal bleeding, contractions, leaking of fluid and fetal movement were reviewed in detail with the patient. Please refer to After Visit Summary for other counseling  recommendations.  Return in about 1 week (around 10/24/2016) for OB f/u.   Levie HeritageJacob J Efrain Clauson, DO

## 2016-10-24 ENCOUNTER — Ambulatory Visit (INDEPENDENT_AMBULATORY_CARE_PROVIDER_SITE_OTHER): Payer: Medicaid Other | Admitting: Obstetrics & Gynecology

## 2016-10-24 VITALS — BP 125/78 | HR 98 | Wt 119.0 lb

## 2016-10-24 DIAGNOSIS — O09899 Supervision of other high risk pregnancies, unspecified trimester: Secondary | ICD-10-CM

## 2016-10-24 DIAGNOSIS — Z3480 Encounter for supervision of other normal pregnancy, unspecified trimester: Secondary | ICD-10-CM

## 2016-10-24 DIAGNOSIS — O0933 Supervision of pregnancy with insufficient antenatal care, third trimester: Secondary | ICD-10-CM

## 2016-10-24 NOTE — Progress Notes (Signed)
   PRENATAL VISIT NOTE  Subjective:  Alison Hooper Alison Hooper is a 26 y.o. G2P1001 at 3624w4d being seen today for ongoing prenatal care.  She is currently monitored for the following issues for this low-risk pregnancy and has Supervision of other normal pregnancy, antepartum; Short interval between pregnancies affecting pregnancy, antepartum; and Late prenatal care affecting pregnancy on her problem list.  Patient reports no complaints.  Contractions: Irritability. Vag. Bleeding: None.  Movement: Present. Denies leaking of fluid.   The following portions of the patient's history were reviewed and updated as appropriate: allergies, current medications, past family history, past medical history, past social history, past surgical history and problem list. Problem list updated.  Objective:   Vitals:   10/24/16 1544  BP: 125/78  Pulse: 98  Weight: 119 lb (54 kg)    Fetal Status: Fetal Heart Rate (bpm): 147   Movement: Present     General:  Alert, oriented and cooperative. Patient is in no acute distress.  Skin: Skin is warm and dry. No rash noted.   Cardiovascular: Normal heart rate noted  Respiratory: Normal respiratory effort, no problems with respiration noted  Abdomen: Soft, gravid, appropriate for gestational age. Pain/Pressure: Present     Pelvic:  Cervical exam performed        Extremities: Normal range of motion.  Edema: None  Mental Status: Normal mood and affect. Normal behavior. Normal judgment and thought content.   Assessment and Plan:  Pregnancy: G2P1001 at 8524w4d  1. Supervision of other normal pregnancy, antepartum BPP in 2 days If undel at 41 week rec IOL. Scheduled.. 2. Short interval between pregnancies affecting pregnancy, antepartum  3. Late prenatal care affecting pregnancy in third trimester  Term labor symptoms and general obstetric precautions including but not limited to vaginal bleeding, contractions, leaking of fluid and fetal movement were reviewed in detail with  the patient. Please refer to After Visit Summary for other counseling recommendations.  F/U in 1 week for IOL at 41 weeks F/u in ofc for 6 weeks PP eval   Willodean Rosenthalarolyn Harraway-Smith, MD

## 2016-10-24 NOTE — Patient Instructions (Signed)
Natural Childbirth Natural childbirth is going through labor and delivery without any drugs to relieve pain. Additionally, fetal monitors are not used, a delivery is not done, and a surgical cut to enlarge the vaginal opening (episiotomy) is not made. With the help of a birthing professional (midwife or health care provider), you direct your own labor and delivery. Many women choose natural childbirth because it makes them feel more in control and in touch with their labor and delivery. Some woman also choose natural childbirth because they are concerned about medicines affecting them and their babies. Pregnant women with a high-risk pregnancy should not attempt natural childbirth. It is better to deliver the infant in a hospital if an emergency situation arises. Sometimes, a health care provider has to get involved for the health and safety of the mother and infant. Techniques for natural childbirth  The Lamaze method-This method teaches parents that having a baby is normal, healthy, and natural. It also teaches the mother to take a neutral position regarding pain medicine and numbing medicines and to make an informed decision about using these medicines when the time comes.  The Bradley method (also called husband-coached birth)-This method teaches the father or partner to be the birth coach. It encourages the mother to exercise and eat a balanced, nutritious diet. It also involves relaxation and deep breathing exercises and preparing the parents for emergency situations. Methods of dealing with labor pain and delivery  Meditation.  Yoga.  Hypnosis.  Acupuncture.  Massage.  Changing positions (walking, rocking, showering, leaning on birth balls).  Lying in warm water or a whirlpool bath.  Finding an activity that keeps your mind off of the labor pain.  Listening to soft music.  Focusing on a particular object (visual imagery). Before going into labor  Be sure you and your spouse or  partner are in agreement about having a natural childbirth.  Decide if your health care provider or a midwife will deliver your baby.  Decide if you will have your baby in the hospital, at a birthing center, or at home.  If you have children, make plans to have someone take care of them when you go to the hospital or birthing center.  Know the distance and the time it takes to go to the hospital or birthing center. Practice going there and time it to be sure.  Have a bag packed with a nightgown, bathrobe, and toiletries. Be ready to take it with you when you go into labor.  Keep phone numbers of your family and friends handy if you need to call someone when you go into labor.  Your spouse or partner should go to all the natural childbirth technique classes.  Talk with your health care provider about the possibility of a medical emergency and what will happen if that occurs. Advantages of natural childbirth  You are in control of your labor and delivery.  You will not take medicines that could affect you and the baby.  There are no invasive procedures, such as an episiotomy.  You and your spouse or partner will work together, which can increase your bond with each other.  In most delivery centers, your family and friends can be involved in the labor and delivery process. Disadvantages of natural childbirth  You will experience pain during your labor and delivery.  The methods of helping relieve your labor pains may not work for you.  You may feel disappointed if you decide to change your mind during labor and not   have a natural childbirth. After the delivery  You will be very tired.  You will be uncomfortable because of your uterus contracting. You will feel soreness around the vagina.  You may feel cold and shaky. This is a natural reaction. This information is not intended to replace advice given to you by your health care provider. Make sure you discuss any questions you  have with your health care provider. Document Released: 08/16/2008 Document Revised: 02/09/2016 Document Reviewed: 05/11/2013 Elsevier Interactive Patient Education  2017 Elsevier Inc.  

## 2016-10-25 ENCOUNTER — Telehealth (HOSPITAL_COMMUNITY): Payer: Self-pay | Admitting: *Deleted

## 2016-10-25 NOTE — Telephone Encounter (Signed)
Preadmission screen  

## 2016-10-26 ENCOUNTER — Ambulatory Visit (HOSPITAL_COMMUNITY): Admission: RE | Admit: 2016-10-26 | Payer: Medicaid Other | Source: Ambulatory Visit

## 2016-10-26 ENCOUNTER — Encounter (HOSPITAL_COMMUNITY): Payer: Self-pay | Admitting: *Deleted

## 2016-10-26 ENCOUNTER — Inpatient Hospital Stay (HOSPITAL_COMMUNITY)
Admission: AD | Admit: 2016-10-26 | Discharge: 2016-10-28 | DRG: 775 | Disposition: A | Discharge status: Home / Self Care | Payer: Medicaid Other | Source: Ambulatory Visit | Attending: Obstetrics and Gynecology | Admitting: Obstetrics and Gynecology

## 2016-10-26 DIAGNOSIS — Z3A39 39 weeks gestation of pregnancy: Secondary | ICD-10-CM

## 2016-10-26 DIAGNOSIS — O99824 Streptococcus B carrier state complicating childbirth: Secondary | ICD-10-CM | POA: Diagnosis not present

## 2016-10-26 DIAGNOSIS — Z8249 Family history of ischemic heart disease and other diseases of the circulatory system: Secondary | ICD-10-CM | POA: Diagnosis not present

## 2016-10-26 DIAGNOSIS — Z3493 Encounter for supervision of normal pregnancy, unspecified, third trimester: Secondary | ICD-10-CM | POA: Diagnosis present

## 2016-10-26 LAB — CBC
HEMATOCRIT: 34 % — AB (ref 36.0–46.0)
Hemoglobin: 11.4 g/dL — ABNORMAL LOW (ref 12.0–15.0)
MCH: 28.7 pg (ref 26.0–34.0)
MCHC: 33.5 g/dL (ref 30.0–36.0)
MCV: 85.6 fL (ref 78.0–100.0)
Platelets: 296 10*3/uL (ref 150–400)
RBC: 3.97 MIL/uL (ref 3.87–5.11)
RDW: 13.7 % (ref 11.5–15.5)
WBC: 20.6 10*3/uL — ABNORMAL HIGH (ref 4.0–10.5)

## 2016-10-26 LAB — TYPE AND SCREEN
ABO/RH(D): A POS
Antibody Screen: NEGATIVE

## 2016-10-26 MED ORDER — DIBUCAINE 1 % RE OINT: 1.0000 "application " | TOPICAL_OINTMENT | RECTAL | Status: DC | PRN

## 2016-10-26 MED ORDER — DOCUSATE SODIUM 100 MG PO CAPS
100.0000 mg | ORAL_CAPSULE | Freq: Two times a day (BID) | ORAL | Status: DC
  Administered 2016-10-26 – 2016-10-28 (×4): 100 mg via ORAL
  Filled 2016-10-26 (×4): qty 1

## 2016-10-26 MED ORDER — FENTANYL CITRATE (PF) 100 MCG/2ML IJ SOLN: 50.0000 ug | INTRAMUSCULAR | Status: DC | PRN

## 2016-10-26 MED ORDER — FLEET ENEMA 7-19 GM/118ML RE ENEM: 1.0000 | ENEMA | RECTAL | Status: DC | PRN

## 2016-10-26 MED ORDER — OXYTOCIN 10 UNIT/ML IJ SOLN
INTRAMUSCULAR | Status: AC
  Administered 2016-10-26: 10 [IU] via INTRAMUSCULAR
  Filled 2016-10-26: qty 1

## 2016-10-26 MED ORDER — ACETAMINOPHEN 325 MG PO TABS: 650.0000 mg | ORAL_TABLET | ORAL | Status: DC | PRN

## 2016-10-26 MED ORDER — OXYTOCIN 40 UNITS IN LACTATED RINGERS INFUSION - SIMPLE MED: 2.5000 [IU]/h | INTRAVENOUS | Status: DC

## 2016-10-26 MED ORDER — MEASLES, MUMPS & RUBELLA VAC ~~LOC~~ INJ
0.5000 mL | INJECTION | Freq: Once | SUBCUTANEOUS | Status: DC
  Filled 2016-10-26: qty 0.5

## 2016-10-26 MED ORDER — ZOLPIDEM TARTRATE 5 MG PO TABS: 5.0000 mg | ORAL_TABLET | Freq: Every evening | ORAL | Status: DC | PRN

## 2016-10-26 MED ORDER — FERROUS SULFATE 325 (65 FE) MG PO TABS
325.0000 mg | ORAL_TABLET | Freq: Two times a day (BID) | ORAL | Status: DC
  Administered 2016-10-26 – 2016-10-28 (×5): 325 mg via ORAL
  Filled 2016-10-26 (×5): qty 1

## 2016-10-26 MED ORDER — FLEET ENEMA 7-19 GM/118ML RE ENEM: 1.0000 | ENEMA | Freq: Every day | RECTAL | Status: DC | PRN

## 2016-10-26 MED ORDER — OXYCODONE-ACETAMINOPHEN 5-325 MG PO TABS: 2.0000 | ORAL_TABLET | ORAL | Status: DC | PRN

## 2016-10-26 MED ORDER — OXYCODONE-ACETAMINOPHEN 5-325 MG PO TABS: 1.0000 | ORAL_TABLET | ORAL | Status: DC | PRN

## 2016-10-26 MED ORDER — ONDANSETRON HCL 4 MG/2ML IJ SOLN: 4.0000 mg | INTRAMUSCULAR | Status: DC | PRN

## 2016-10-26 MED ORDER — ERYTHROMYCIN 5 MG/GM OP OINT
TOPICAL_OINTMENT | OPHTHALMIC | Status: AC
  Filled 2016-10-26: qty 1

## 2016-10-26 MED ORDER — LACTATED RINGERS IV SOLN: 500.0000 mL | INTRAVENOUS | Status: DC | PRN

## 2016-10-26 MED ORDER — SOD CITRATE-CITRIC ACID 500-334 MG/5ML PO SOLN: 30.0000 mL | ORAL | Status: DC | PRN

## 2016-10-26 MED ORDER — COCONUT OIL OIL
1.0000 "application " | TOPICAL_OIL | Status: DC | PRN
  Administered 2016-10-27: 1 via TOPICAL
  Filled 2016-10-26: qty 120

## 2016-10-26 MED ORDER — BISACODYL 10 MG RE SUPP: 10.0000 mg | Freq: Every day | RECTAL | Status: DC | PRN

## 2016-10-26 MED ORDER — OXYCODONE HCL 5 MG PO TABS: 5.0000 mg | ORAL_TABLET | ORAL | Status: DC | PRN

## 2016-10-26 MED ORDER — ACETAMINOPHEN 325 MG PO TABS
650.0000 mg | ORAL_TABLET | ORAL | Status: DC | PRN
Start: 2016-10-26 — End: 2016-10-28

## 2016-10-26 MED ORDER — OXYTOCIN BOLUS FROM INFUSION: 500.0000 mL | Freq: Once | INTRAVENOUS | Status: DC

## 2016-10-26 MED ORDER — TETANUS-DIPHTH-ACELL PERTUSSIS 5-2.5-18.5 LF-MCG/0.5 IM SUSP: 0.5000 mL | Freq: Once | INTRAMUSCULAR | Status: DC

## 2016-10-26 MED ORDER — OXYCODONE HCL 5 MG PO TABS: 10.0000 mg | ORAL_TABLET | ORAL | Status: DC | PRN

## 2016-10-26 MED ORDER — DIPHENHYDRAMINE HCL 25 MG PO CAPS
25.0000 mg | ORAL_CAPSULE | Freq: Four times a day (QID) | ORAL | Status: DC | PRN
Start: 2016-10-26 — End: 2016-10-28

## 2016-10-26 MED ORDER — METHYLERGONOVINE MALEATE 0.2 MG/ML IJ SOLN: 0.2000 mg | INTRAMUSCULAR | Status: DC | PRN

## 2016-10-26 MED ORDER — LACTATED RINGERS IV SOLN: INTRAVENOUS | Status: DC

## 2016-10-26 MED ORDER — SIMETHICONE 80 MG PO CHEW: 80.0000 mg | CHEWABLE_TABLET | ORAL | Status: DC | PRN

## 2016-10-26 MED ORDER — ONDANSETRON HCL 4 MG PO TABS: 4.0000 mg | ORAL_TABLET | ORAL | Status: DC | PRN

## 2016-10-26 MED ORDER — PRENATAL MULTIVITAMIN CH
1.0000 | ORAL_TABLET | Freq: Every day | ORAL | Status: DC
  Administered 2016-10-26 – 2016-10-28 (×3): 1 via ORAL
  Filled 2016-10-26 (×3): qty 1

## 2016-10-26 MED ORDER — METHYLERGONOVINE MALEATE 0.2 MG PO TABS: 0.2000 mg | ORAL_TABLET | ORAL | Status: DC | PRN

## 2016-10-26 MED ORDER — LIDOCAINE HCL (PF) 1 % IJ SOLN
30.0000 mL | INTRAMUSCULAR | Status: DC | PRN
  Filled 2016-10-26: qty 30

## 2016-10-26 MED ORDER — BENZOCAINE-MENTHOL 20-0.5 % EX AERO: 1.0000 "application " | INHALATION_SPRAY | CUTANEOUS | Status: DC | PRN

## 2016-10-26 MED ORDER — OXYTOCIN 10 UNIT/ML IJ SOLN
10.0000 [IU] | Freq: Once | INTRAMUSCULAR | Status: AC
  Administered 2016-10-26: 10 [IU] via INTRAMUSCULAR

## 2016-10-26 MED ORDER — ONDANSETRON HCL 4 MG/2ML IJ SOLN: 4.0000 mg | Freq: Four times a day (QID) | INTRAMUSCULAR | Status: DC | PRN

## 2016-10-26 MED ORDER — IBUPROFEN 600 MG PO TABS
600.0000 mg | ORAL_TABLET | Freq: Four times a day (QID) | ORAL | Status: DC
  Administered 2016-10-26 – 2016-10-28 (×9): 600 mg via ORAL
  Filled 2016-10-26 (×9): qty 1

## 2016-10-26 MED ORDER — WITCH HAZEL-GLYCERIN EX PADS: 1.0000 "application " | MEDICATED_PAD | CUTANEOUS | Status: DC | PRN

## 2016-10-26 NOTE — H&P (Signed)
Alison Hooper is a 26 y.o. female G2P1001 with IUP at 2856w6d presenting for contractions that started at 0100.  Does not know when water broke, but baby's hair is visible with pushes. PNCare at [facility] since 17 wks  Prenatal History/Complications:  Late care, short pregnancy interval   Past Medical History: Past Medical History:  Diagnosis Date  . Medical history non-contributory     Past Surgical History: Past Surgical History:  Procedure Laterality Date  . NO PAST SURGERIES      Obstetrical History: OB History    Gravida Para Term Preterm AB Living   2 1 1     1    SAB TAB Ectopic Multiple Live Births         0 1       Social History: Social History   Social History  . Marital status: Married    Spouse name: N/A  . Number of children: N/A  . Years of education: N/A   Social History Main Topics  . Smoking status: Never Smoker  . Smokeless tobacco: Never Used  . Alcohol use No  . Drug use: No  . Sexual activity: Yes    Birth control/ protection: None   Other Topics Concern  . Not on file   Social History Narrative  . No narrative on file    Family History: Family History  Problem Relation Age of Onset  . Hypertension Mother   . Hypertension Father     Allergies: Allergies  Allergen Reactions  . Phenergan [Promethazine Hcl] Anxiety    Prescriptions Prior to Admission  Medication Sig Dispense Refill Last Dose  . folic acid (FOLVITE) 1 MG tablet Take 1 tablet (1 mg total) by mouth daily. 30 tablet 1 Taking  . metoCLOPramide (REGLAN) 10 MG tablet Take 1 tablet (10 mg total) by mouth every 6 (six) hours. 30 tablet 0 Taking  . potassium chloride SA (K-DUR,KLOR-CON) 20 MEQ tablet Take 1 tablet (20 mEq total) by mouth 2 (two) times daily. (Patient not taking: Reported on 10/24/2016) 4 tablet 0 Not Taking  . ranitidine (ZANTAC) 150 MG tablet Take 1 tablet (150 mg total) by mouth 2 (two) times daily. 60 tablet 0 Taking        Review of Systems    Constitutional: Negative for fever and chills Eyes: Negative for visual disturbances Respiratory: Negative for shortness of breath, dyspnea Cardiovascular: Negative for chest pain or palpitations  Gastrointestinal: Negative for vomiting, diarrhea and constipation.  POSITIVE for abdominal pain (contractions) Genitourinary: Negative for dysuria and urgency Musculoskeletal: Negative for back pain, joint pain, myalgias  Neurological: Negative for dizziness and headaches      Last menstrual period 01/21/2016, currently breastfeeding. General appearance: alert, cooperative and mild distress Lungs: clear to auscultation bilaterally Heart: regular rate and rhythm Abdomen: soft, non-tender; bowel sounds normal Extremities: Homans sign is negative, no sign of DVT DTR's 2+ Presentation: cephalic Fetal monitoring  Baseline: 135 bpm, Variability: Good {> 6 bpm), Accelerations: Reactive and Decelerations: Absent Uterine activity  2     Prenatal labs:   [facility] Prenatal Labs  Dating LMP equal to second trimester Blood type: A/POS/-- (09/18 0956)   Genetic Screen  Quad: negative     Antibody:NEG (09/18 0956)  Anatomic US  Normal Rubella: 5.16 (09/18 0956)  GTT  Third trimester: 94 RPR: NON REAC (09/18 0956)   Flu vaccine 07/19/2016 HBsAg: NEGATIVE (09/18 0956)   TDaP vaccine  07/19/2016  HIV: NONREACTIVE (09/18 0956)   Baby Food     breast                                          GBS: positive  Contraception  ? Mirena ONG:EXBM 10/3//2016   Circumcision  n/a   Pediatrician  [facility]   Support Person  Husband John         Assessment: Alison Hooper is a 26 y.o. G2P1001 with an IUP at [redacted]w[redacted]d presenting for active labor  Plan: #Labor: expect SVD soon in MAU #FWB Cat 1 GBS+, but not treated.  Baby delivered in MAU  CRESENZO-DISHMAN,Linwood Gullikson 10/26/2016, 5:39 AM

## 2016-10-26 NOTE — MAU Note (Signed)
Pt presents to MAU for contractions. Pt 10cm and involuntarily pushing. Cathie BeamsFran Cresenzo-Dishmon CNM notified and present for delivery. FHR 140's. Female fetus delivered at [address]12.

## 2016-10-26 NOTE — Progress Notes (Signed)
UR chart review completed.  

## 2016-10-26 NOTE — Lactation Note (Signed)
This note was copied from a baby's chart. Lactation Consultation Note  Patient Name: Alison Hooper VWUJW'JToday's Date: 10/26/2016 Reason for consult: Initial assessment  Baby 10 hrs old. 8755w6d, and 5 lbs 9.8 oz.  Second baby, 1st baby 111 months old and weighed the same (<6 lbs).  Baby has not latched from more than a couple sucks.  Mom keeping baby skin to skin, and attempting whenever baby cues.  Assisted with placing baby in cross cradle and football holds.  Baby opens and can latch deeply onto areola, but pushes off the breast and cries.  Mom hand expressing  drops of colostrum into baby's mouth.  Talked about spoon feeding her colostrum to baby.  Mom stated she would rather use pump, so LC set up DEBP at bedside, with instructions on use, and cleaning of parts.  Mom wants to wait as family was coming.  Recommended to keep baby STS, and call for assistance as needed.    Mom to ask her RN for assistance with pumping. Brochure left with Mom.  Informed her of IP and OP lactation services.  Consult Status Consult Status: Follow-up Date: 10/27/16 Follow-up type: In-patient    [name], [name] E 10/26/2016, 3:37 PM

## 2016-10-27 LAB — RPR: RPR Ser Ql: NONREACTIVE

## 2016-10-27 NOTE — Lactation Note (Addendum)
This note was copied from a baby's chart. Lactation Consultation Note  Patient Name: Alison Hooper YNWGN'FToday's Date: 10/27/2016 Reason for consult: Follow-up assessment  With this mom and term but small baby, weight 5 lbs 6.8 oz, now 34 hours old. Mom reports the baby being fed at least every 3 hours, and wet and dirty diapers are WNL.Mom has not been pumping. I told her if baby' weight drops more than WNL, then the baby will probably need to be supplemented.Mom has easily expressed colostrum.  Pumping will allow mom to supplement with EBM, as opposed to formula. Mom receptive to teaching, holding baby skin to skin after breast feeding, baby alert, calm.    Maternal Data    Feeding Feeding Type: Breast Fed Length of feed: 15 min  LATCH Score/Interventions Latch: Grasps breast easily, tongue down, lips flanged, rhythmical sucking.  Audible Swallowing: A few with stimulation Intervention(s): Skin to skin;Hand expression;Alternate breast massage  Type of Nipple: Everted at rest and after stimulation  Comfort (Breast/Nipple): Soft / non-tender     Hold (Positioning): No assistance needed to correctly position infant at breast. Intervention(s): Breastfeeding basics reviewed;Support Pillows;Position options;Skin to skin  LATCH Score: 9  Lactation Tools Discussed/Used     Consult Status Consult Status: Follow-up Date: 10/28/16 Follow-up type: In-patient    Alfred LevinsLee, Marilyn Nihiser Anne 10/27/2016, 3:46 PM

## 2016-10-27 NOTE — Progress Notes (Signed)
Patient ID: Algis Limingel Mae Duthie, female   DOB: 05/05/1991, 26 y.o.   MRN: [id number]  POSTPARTUM PROGRESS NOTE  Post Partum Day #1 Subjective:  Jorja Silvestre GunnerMae Rajagopalan is a 26 y.o. [id number] 5967w6d s/p NSVD.  No acute events overnight.  Pt denies problems with ambulating, voiding or po intake.  She denies nausea or vomiting.  Pain is well controlled.  She has had flatus. She has not had bowel movement.  Lochia Small.   Objective: Blood pressure 110/69, pulse 81, temperature 97.9 F (36.6 C), temperature source Oral, resp. rate 18, last menstrual period 01/21/2016, SpO2 100 %, unknown if currently breastfeeding.  Physical Exam:  General: alert, cooperative and no distress Lochia:normal flow Chest: CTAB Heart: RRR no m/r/g Abdomen: +BS, soft, nontender,  Uterine Fundus: firm, below umbilicus DVT Evaluation: No calf swelling or tenderness Extremities: No edema   Recent Labs  10/26/16 0815  HGB 11.4*  HCT 34.0*    Assessment/Plan:  ASSESSMENT: Joy Silvestre GunnerMae Berkey is a 26 y.o. [id number] 2967w6d s/p NSVD  Plan for discharge tomorrow, Breastfeeding and Contraception LARC  Precipitous delivery, baby to be watched for 48 hours   LOS: 1 day   Jen MowElizabeth Frady Taddeo, DO OB Fellow [facility] 10/27/2016, 3:26 PM

## 2016-10-28 MED ORDER — DOCUSATE SODIUM 100 MG PO CAPS: 100.0000 mg | ORAL_CAPSULE | Freq: Two times a day (BID) | ORAL | 0 refills | Status: AC

## 2016-10-28 MED ORDER — IBUPROFEN 600 MG PO TABS: 600.0000 mg | ORAL_TABLET | Freq: Four times a day (QID) | ORAL | 0 refills | Status: AC

## 2016-10-28 NOTE — Discharge Summary (Signed)
OB Discharge Summary     Patient Name: Alison Hooper DOB: 08/25/1991 MRN: [id number]  Date of admission: 10/26/2016 Delivering MD: Jacklyn ShellRESENZO-DISHMON, FRANCES   Date of discharge: 10/28/2016  Admitting diagnosis: Contractions  Intrauterine pregnancy: 171w6d     Secondary diagnosis:  Principal Problem:   SVD (spontaneous vaginal delivery) Active Problems:   Normal labor  Additional problems: None     Discharge diagnosis: Term Pregnancy Delivered                                                                                                Post partum procedures:None  Augmentation: None  Complications: None  Hospital course:  Onset of Labor With Vaginal Delivery     26 y.o. yo [id number] at 5871w6d was admitted in Active Labor on 10/26/2016 with a precipitous delivery in MAU. Patient had an uncomplicated labor course as follows:  Membrane Rupture Time/Date: 5:05 AM ,10/26/2016   Intrapartum Procedures: Episiotomy: None [1]                                         Lacerations:  None [1]  Patient had a delivery of a Viable infant. 10/26/2016  Information for the patient's newborn:  Alison Hooper, Girl Alison Hooper [[id number]][[id number]]  Delivery Method: Vaginal, Spontaneous Delivery (Filed from Delivery Summary)    Pateint had an uncomplicated postpartum course.  She is ambulating, tolerating a regular diet, passing flatus, and urinating well. Patient is discharged home in stable condition on 10/28/16.   Physical exam  Vitals:   10/26/16 1802 10/27/16 0550 10/27/16 1906 10/28/16 0536  BP: 109/69 110/69 127/75 115/75  Pulse: 95 81 (!) 102 74  Resp: 18 18 15 18   Temp: 97.7 F (36.5 C) 97.9 F (36.6 C) 98.4 F (36.9 C) 97.8 F (36.6 C)  TempSrc: Axillary Oral Oral Oral  SpO2:       General: alert, cooperative and no distress Lochia: appropriate Uterine Fundus: firm Incision: N/A DVT Evaluation: No evidence of DVT seen on physical exam. Negative Homan's sign. No cords or calf tenderness. Labs: Lab  Results  Component Value Date   WBC 20.6 (H) 10/26/2016   HGB 11.4 (L) 10/26/2016   HCT 34.0 (L) 10/26/2016   MCV 85.6 10/26/2016   PLT 296 10/26/2016   CMP Latest Ref Rng & Units 06/04/2016  Glucose 65 - 99 mg/dL 81  BUN 7 - 25 mg/dL 8  Creatinine 2.530.50 - 6.641.10 mg/dL 4.03(K0.43(L)  Sodium 742135 - 595146 mmol/L 132(L)  Potassium 3.5 - 5.3 mmol/L 4.2  Chloride 98 - 110 mmol/L 101  CO2 20 - 31 mmol/L 22  Calcium 8.6 - 10.2 mg/dL 9.6  Total Protein 6.1 - 8.1 g/dL 7.3  Total Bilirubin 0.2 - 1.2 mg/dL 0.2  Alkaline Phos 33 - 115 U/L 36  AST 10 - 30 U/L 13  ALT 6 - 29 U/L 5(L)    Discharge instruction: per After Visit Summary and "Baby and Me Booklet".  After visit meds:  Allergies as of 10/28/2016  Reactions   Phenergan [promethazine Hcl] Anxiety      Medication List    STOP taking these medications   folic acid 1 MG tablet Commonly known as:  FOLVITE     TAKE these medications   docusate sodium 100 MG capsule Commonly known as:  COLACE Take 1 capsule (100 mg total) by mouth 2 (two) times daily.   ibuprofen 600 MG tablet Commonly known as:  ADVIL,MOTRIN Take 1 tablet (600 mg total) by mouth every 6 (six) hours.   prenatal multivitamin Tabs tablet Take 1 tablet by mouth daily at 12 noon.       Diet: routine diet  Activity: Advance as tolerated. Pelvic rest for 6 weeks.   Outpatient follow up:6 weeks Follow up Appt:Future Appointments Date Time Provider Department Center  11/03/2016 12:00 AM [facility] None  12/05/2016 3:15 PM Willodean Rosenthal, MD [facility] None   Follow up Visit:No Follow-up on file.  Postpartum contraception: IUD or LARC  Newborn Data: Live born female  Birth Weight: 5 lb 9.8 oz (2545 g) APGAR: 9, 9  Baby Feeding: Breast Disposition:home with mother   10/28/2016 Alison Mow, DO OB Fellow

## 2016-10-28 NOTE — Discharge Instructions (Signed)

## 2016-10-28 NOTE — Lactation Note (Signed)
This note was copied from a baby's chart. Lactation Consultation Note  Baby 54 hours old < 6 lbs.  6.7% weight loss.  3.4% in the last 24 hours. Per chart and mother baby is breastfeeding well.  Discussed adding pumping to give baby some more volume. Mother states she has DEBP at home and pumped approx 5 oz from each breast with last baby and plans to pump when she gets home. Suggest this baby needs more volume so post pump 4-6 times a day for 10-20 min and give volume back to baby in addition to breastfeeding. Parents seems agreeable. Reviewed engorgement care and monitoring voids/stools. Mom encouraged to feed baby 8-12 times/24 hours and with feeding cues at least q 3 hours. Mom has my # to call for assist w/next feeding.     Patient Name: Alison Hooper AVWUJ'WToday's Date: 10/28/2016 Reason for consult: Follow-up assessment;Infant < 6lbs   Maternal Data    Feeding Feeding Type: Breast Fed Length of feed: 20 min  LATCH Score/Interventions                      Lactation Tools Discussed/Used     Consult Status Consult Status: Follow-up Date: 10/29/16 Follow-up type: In-patient    Dahlia ByesBerkelhammer, Ruth [facility] 10/28/2016, 11:30 AM

## 2016-11-03 ENCOUNTER — Inpatient Hospital Stay (HOSPITAL_COMMUNITY): Admission: RE | Admit: 2016-11-03 | Payer: Medicaid Other | Source: Ambulatory Visit | Admitting: Family Medicine

## 2016-12-05 ENCOUNTER — Ambulatory Visit (INDEPENDENT_AMBULATORY_CARE_PROVIDER_SITE_OTHER): Payer: Medicaid Other | Admitting: Obstetrics & Gynecology

## 2016-12-05 NOTE — Progress Notes (Signed)
Post Partum Exam  Alison Hooper is a 26 y.o. 392P2002 female who presents for a postpartum visit. She is 6 weeks postpartum following a spontaneous vaginal delivery. I have fully reviewed the prenatal and intrapartum course. The delivery was at 39.6 gestational weeks.  Anesthesia: none. Postpartum course has been uneventful. Baby's course has been uneventful. Baby is feeding by breast. Bleeding no bleeding. Bowel function is normal. Bladder function is normal. Patient is not sexually active. Contraception method is condoms. Postpartum depression screening:neg (score:4)  The following portions of the patient's history were reviewed and updated as appropriate: allergies, current medications, past family history, past medical history, past social history, past surgical history and problem list.  Review of Systems Pertinent items are noted in HPI.    Objective:  unknown if currently breastfeeding.  BP 130/88 (BP Location: Left Arm)   Pulse 77   Ht 4\' 11"  (1.499 m)   Wt 107 lb (48.5 kg)   Breastfeeding? Yes   BMI 21.61 kg/m  BP 130/88 (BP Location: Left Arm)   Pulse 77   Ht 4\' 11"  (1.499 m)   Wt 107 lb (48.5 kg)   Breastfeeding? Yes   BMI 21.61 kg/m  CONSTITUTIONAL: Well-developed, well-nourished female in no acute distress.  HENT:  Normocephalic, atraumatic EYES: Conjunctivae and EOM are normal. No scleral icterus.  NECK: Normal range of motion SKIN: Skin is warm and dry. No rash noted. Not diaphoretic.No pallor. NEUROLGIC: Alert and oriented to person, place, and time. Normal coordination.    Assessment:    6 weeks postpartum exam. Pap smear not done at today's visit.   Plan:   1. Contraception: abstinence 2. Pt will f/u with TC to request for Depo Provera or OCPs once she stops nursing  3. Follow up in: 1 year or as needed.   Yariel Ferraris L. Harraway-Smith, M.D., Evern CoreFACOG

## 2016-12-05 NOTE — Patient Instructions (Signed)
Breastfeeding Twins or Multiples Choosing to breastfeed your babies has many benefits:  It helps your uterus return to its original size faster.  It releases hormones that relax you.  It saves money and time. Your milk is available at the right temperature and whenever your babies are ready to feed.  It ensures your babies get the best nutrition.  It creates a unique bond between you and each of your babies. Breast milk is beneficial for all babies, but it is especially beneficial to multiples, who are often small at birth and need all the advantages breast milk can provide. Mothers of multiples typically produce enough milk for all their babies. When should I start breastfeeding? Nurse as soon as possible and as often as your babies want to be nursed. This will cause your body to produce enough milk for all your babies. Work with a Advertising copywriter as soon as possible. It is important to assess each infant at the breast to make sure they can latch and feed. If your babies were born prematurely and are unable to nurse, you can pump your breasts and freeze the milk until your babies are ready to feed at the breast. To make sure that your body makes enough milk, empty your breast at least 8-10 times in a 24-hour period. Ask a lactation specialist to help you choose an effective breast pump and for guidance in helping your babies latch on to and feed from the breast when they are ready. Should I nurse my babies together? It is up to you whether you nurse your babies together or separately. However, many mothers of multiples find that it is easier and time-saving to nurse two babies at the same time. Nursing babies together may also help establish your milk supply. Nursing two babies at the same time can be tricky at first, but it often gets easier as the babies get older and more experienced at latching on to the breast. When nursing your babies together:  Ask your nurse or lactation specialist  to suggest tips on positioning. There are several positions and holds that make it easier to nurse more than one baby at a time.  Try placing pillows under your arms and legs and under your babies for comfort. Use a nursing pillow specially designed for multiples.  Switch your babies from one side to the other at alternate feedings. For example, if baby A feeds from the right breast and baby B feeds from the left breast, then at the next feeding baby A should take the left breast and baby B the right breast. This ensures that both breasts get equal amounts of stimulation. It also allows the stronger sucking baby to increase the milk supply for the baby whose suck is weaker. What are some tips to increase my success?  A good latch helps the babies empty the breasts. It also prevents sore nipples. If you are nursing babies together and one of the babies is having difficulty latching or sucking, try nursing that baby separately. That way you can give her or him your full attention.  If you are nursing babies separately and one of the babies is having difficulty feeding, it may help to nurse that baby and his or her sibling together. The baby with the stronger or more effective suck will stimulate the mother's milk to flow faster.  Try not to give your babies bottles and pacifiers during the early weeks of breastfeeding. Avoiding these encourages effective sucking patterns and helps establish  a good milk supply. You should not need supplemental feedings if you empty your breasts with each feeding.  Keep track of each baby's stools and wet diapers for the first 6 weeks to make sure each baby is getting enough milk. Signs that your babies are getting enough milk include:  Wetting at least 1-2 diapers in the first 24 hours after birth.  Wetting at least 5-6 diapers every 24 hours for the first week after birth. The urine should be clear and pale yellow by 5 days after birth.  Wetting 6-8 diapers every 24  hours as your babies grow and develop.  Producing a healthy amount of stool:  By the time your babies are 825 days old, they should produce at least 3 stools in a 24-hour period. The stools should be soft and yellow.  By the time your babies are 177 days old, they should produce at least 3 stools in a 24-hour period. The stools should be seedy and yellow.  Gaining a healthy amount of weight. Talk to your health care provider about how much weight your babies should be gaining.  If your babies do not get enough milk from breastfeeding alone, talk with a lactation specialist. It may be possible to breastfeed part of the time and supplement feedings with donated milk or formula.  Drink plenty of fluids so your urine is clear or pale yellow.  Eat a healthy diet that includes fresh fruits and vegetables, whole grains, lean meat, fish, eggs, beans, nuts, and seeds, and low-fat dairy products. Summary  Breastfeeding is beneficial for twins, multiples, and their mothers.  Nurse your babies as soon as possible after birth. You may nurse two babies at one time.  Work with a lactation specialist to assess your babies' latches, find positioning and breastfeeding strategies that work for you, and overcome any nursing challenges. This information is not intended to replace advice given to you by your health care provider. Make sure you discuss any questions you have with your health care provider. Document Released: 01/01/2005 Document Revised: 09/05/2016 Document Reviewed: 09/05/2016 Elsevier Interactive Patient Education  2017 [employer] Contraception Choices Contraception (birth control) is the use of any methods or devices to prevent pregnancy. Below are some methods to help avoid pregnancy. Hormonal methods  Contraceptive implant. This is a thin, plastic tube containing progesterone hormone. It does not contain estrogen hormone. Your health care provider inserts the tube in the inner part of the  upper arm. The tube can remain in place for up to 3 years. After 3 years, the implant must be removed. The implant prevents the ovaries from releasing an egg (ovulation), thickens the cervical mucus to prevent sperm from entering the uterus, and thins the lining of the inside of the uterus.  Progesterone-only injections. These injections are given every 3 months by your health care provider to prevent pregnancy. This synthetic progesterone hormone stops the ovaries from releasing eggs. It also thickens cervical mucus and changes the uterine lining. This makes it harder for sperm to survive in the uterus.  Birth control pills. These pills contain estrogen and progesterone hormone. They work by preventing the ovaries from releasing eggs (ovulation). They also cause the cervical mucus to thicken, preventing the sperm from entering the uterus. Birth control pills are prescribed by a health care provider.Birth control pills can also be used to treat heavy periods.  Minipill. This type of birth control pill contains only the progesterone hormone. They are taken every day of each month  and must be prescribed by your health care provider.  Birth control patch. The patch contains hormones similar to those in birth control pills. It must be changed once a week and is prescribed by a health care provider.  Vaginal ring. The ring contains hormones similar to those in birth control pills. It is left in the vagina for 3 weeks, removed for 1 week, and then a new one is put back in place. The patient must be comfortable inserting and removing the ring from the vagina.A health care provider's prescription is necessary.  Emergency contraception. Emergency contraceptives prevent pregnancy after unprotected sexual intercourse. This pill can be taken right after sex or up to 5 days after unprotected sex. It is most effective the sooner you take the pills after having sexual intercourse. Most emergency contraceptive pills  are available without a prescription. Check with your pharmacist. Do not use emergency contraception as your only form of birth control. Barrier methods  Female condom. This is a thin sheath (latex or rubber) that is worn over the penis during sexual intercourse. It can be used with spermicide to increase effectiveness.  Female condom. This is a soft, loose-fitting sheath that is put into the vagina before sexual intercourse.  Diaphragm. This is a soft, latex, dome-shaped barrier that must be fitted by a health care provider. It is inserted into the vagina, along with a spermicidal jelly. It is inserted before intercourse. The diaphragm should be left in the vagina for 6 to 8 hours after intercourse.  Cervical cap. This is a round, soft, latex or plastic cup that fits over the cervix and must be fitted by a health care provider. The cap can be left in place for up to 48 hours after intercourse.  Sponge. This is a soft, circular piece of polyurethane foam. The sponge has spermicide in it. It is inserted into the vagina after wetting it and before sexual intercourse.  Spermicides. These are chemicals that kill or block sperm from entering the cervix and uterus. They come in the form of creams, jellies, suppositories, foam, or tablets. They do not require a prescription. They are inserted into the vagina with an applicator before having sexual intercourse. The process must be repeated every time you have sexual intercourse. Intrauterine contraception  Intrauterine device (IUD). This is a T-shaped device that is put in a woman's uterus during a menstrual period to prevent pregnancy. There are 2 types:  Copper IUD. This type of IUD is wrapped in copper wire and is placed inside the uterus. Copper makes the uterus and fallopian tubes produce a fluid that kills sperm. It can stay in place for 10 years.  Hormone IUD. This type of IUD contains the hormone progestin (synthetic progesterone). The hormone  thickens the cervical mucus and prevents sperm from entering the uterus, and it also thins the uterine lining to prevent implantation of a fertilized egg. The hormone can weaken or kill the sperm that get into the uterus. It can stay in place for 3-5 years, depending on which type of IUD is used. Permanent methods of contraception  Female tubal ligation. This is when the woman's fallopian tubes are surgically sealed, tied, or blocked to prevent the egg from traveling to the uterus.  Hysteroscopic sterilization. This involves placing a small coil or insert into each fallopian tube. Your doctor uses a technique called hysteroscopy to do the procedure. The device causes scar tissue to form. This results in permanent blockage of the fallopian tubes, so the  sperm cannot fertilize the egg. It takes about 3 months after the procedure for the tubes to become blocked. You must use another form of birth control for these 3 months.  Female sterilization. This is when the female has the tubes that carry sperm tied off (vasectomy).This blocks sperm from entering the vagina during sexual intercourse. After the procedure, the man can still ejaculate fluid (semen). Natural planning methods  Natural family planning. This is not having sexual intercourse or using a barrier method (condom, diaphragm, cervical cap) on days the woman could become pregnant.  Calendar method. This is keeping track of the length of each menstrual cycle and identifying when you are fertile.  Ovulation method. This is avoiding sexual intercourse during ovulation.  Symptothermal method. This is avoiding sexual intercourse during ovulation, using a thermometer and ovulation symptoms.  Post-ovulation method. This is timing sexual intercourse after you have ovulated. Regardless of which type or method of contraception you choose, it is important that you use condoms to protect against the transmission of sexually transmitted infections (STIs).  Talk with your health care provider about which form of contraception is most appropriate for you. This information is not intended to replace advice given to you by your health care provider. Make sure you discuss any questions you have with your health care provider. Document Released: 09/03/2005 Document Revised: 02/09/2016 Document Reviewed: 02/26/2013 Elsevier Interactive Patient Education  2017 [employer].

## 2018-03-07 ENCOUNTER — Ambulatory Visit: Payer: Self-pay | Admitting: Obstetrics & Gynecology
